# Patient Record
Sex: Female | Born: 1950 | Race: White | Hispanic: No | Marital: Married | Smoking: Current every day smoker
Health system: Southern US, Community
[De-identification: ages and names within clinical notes are randomized; demographics above are authoritative.]

## PROBLEM LIST (undated history)

## (undated) DIAGNOSIS — E785 Hyperlipidemia, unspecified: Secondary | ICD-10-CM

## (undated) HISTORY — PX: TUBAL LIGATION: SHX77

## (undated) HISTORY — DX: Hyperlipidemia, unspecified: E78.5

---

## 2002-08-05 ENCOUNTER — Ambulatory Visit (HOSPITAL_COMMUNITY): Admission: RE | Admit: 2002-08-05 | Discharge: 2002-08-05 | Payer: Self-pay | Admitting: Family Medicine

## 2002-08-05 ENCOUNTER — Encounter: Payer: Self-pay | Admitting: Family Medicine

## 2003-12-06 ENCOUNTER — Ambulatory Visit (HOSPITAL_COMMUNITY): Admission: RE | Admit: 2003-12-06 | Discharge: 2003-12-06 | Payer: Self-pay | Admitting: Family Medicine

## 2005-02-11 ENCOUNTER — Ambulatory Visit (HOSPITAL_COMMUNITY): Admission: RE | Admit: 2005-02-11 | Discharge: 2005-02-11 | Payer: Self-pay | Admitting: Family Medicine

## 2005-02-14 ENCOUNTER — Ambulatory Visit (HOSPITAL_COMMUNITY): Admission: RE | Admit: 2005-02-14 | Discharge: 2005-02-14 | Payer: Self-pay | Admitting: Family Medicine

## 2005-02-24 ENCOUNTER — Ambulatory Visit (HOSPITAL_COMMUNITY): Admission: RE | Admit: 2005-02-24 | Discharge: 2005-02-24 | Payer: Self-pay | Admitting: Family Medicine

## 2005-03-03 ENCOUNTER — Ambulatory Visit (HOSPITAL_COMMUNITY): Admission: RE | Admit: 2005-03-03 | Discharge: 2005-03-03 | Payer: Self-pay | Admitting: Family Medicine

## 2005-05-14 ENCOUNTER — Ambulatory Visit (HOSPITAL_COMMUNITY): Admission: RE | Admit: 2005-05-14 | Discharge: 2005-05-14 | Payer: Self-pay | Admitting: Family Medicine

## 2005-05-27 ENCOUNTER — Ambulatory Visit (HOSPITAL_COMMUNITY): Admission: RE | Admit: 2005-05-27 | Discharge: 2005-05-27 | Payer: Self-pay | Admitting: Family Medicine

## 2005-09-29 ENCOUNTER — Ambulatory Visit (HOSPITAL_COMMUNITY): Admission: RE | Admit: 2005-09-29 | Discharge: 2005-09-29 | Payer: Self-pay | Admitting: Family Medicine

## 2006-06-10 IMAGING — CT CT CHEST W/ CM
1 of 2 series · 14 of 32 positions shown, 18 images · IV contrast (omnipaque)
Comparison: Chest radiograph dated 02/11/05.

CLINICAL DATA: CT CHEST WITH CONTRAST:
 Following the intravenous administration of contrast media (Omnipaque 300) a CT scan of the chest was obtained.

[Series 998: — · axial · 0.56mm/px · z∈[+1568,+1844]mm · 14 of 66 slices shown, 18 images]
[im 6/66  mediastinal]
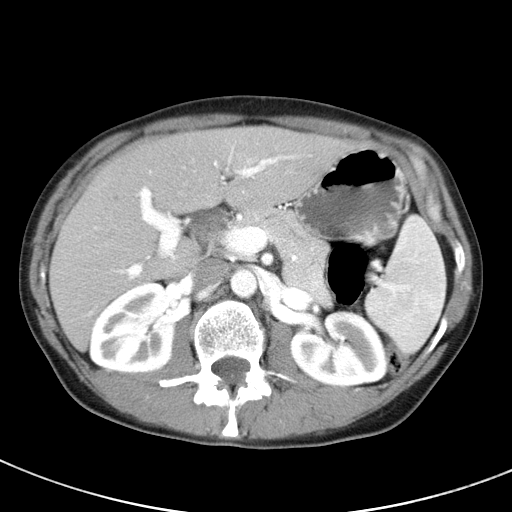
[im 6/66  lung]
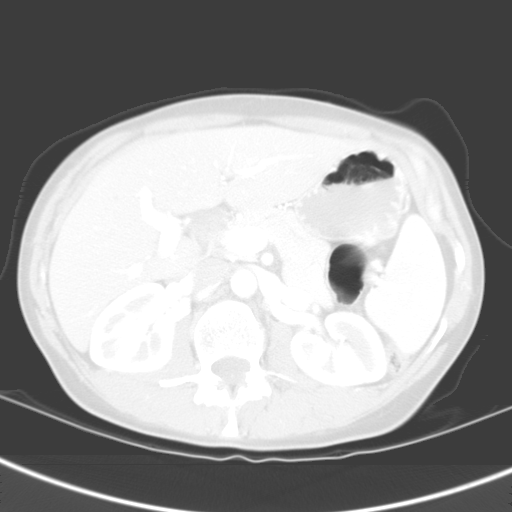
[im 11/66  lung]
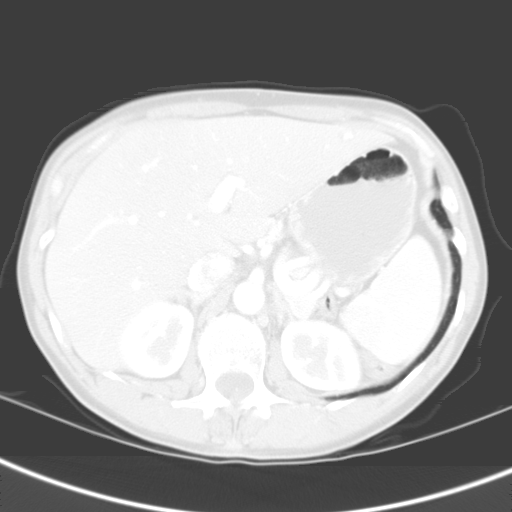
[im 16/66  lung]
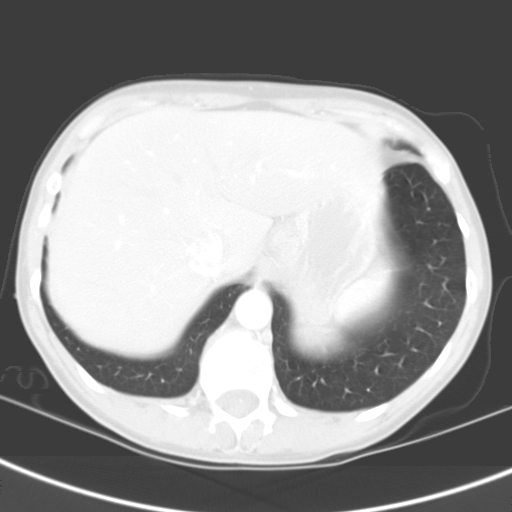
[im 21/66  lung]
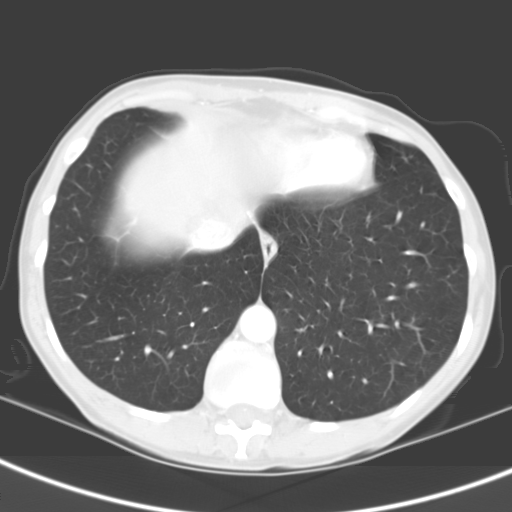
[im 26/66  mediastinal]
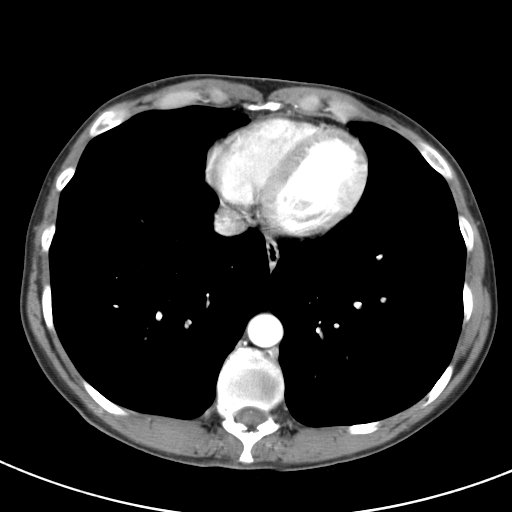
[im 26/66  lung]
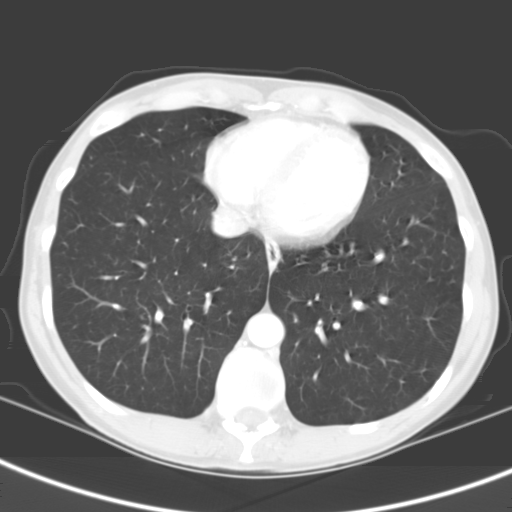
[im 31/66  lung]
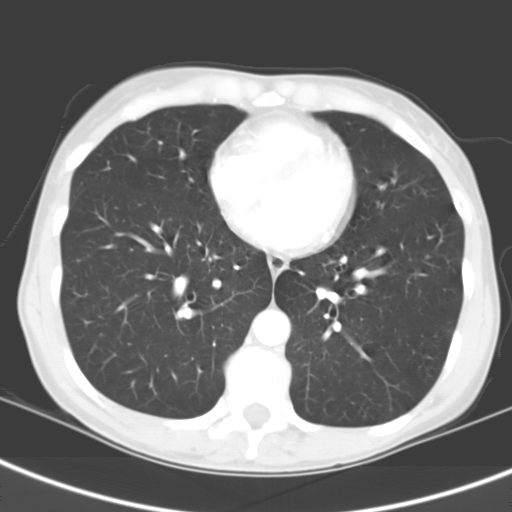
[im 32/66  lung]
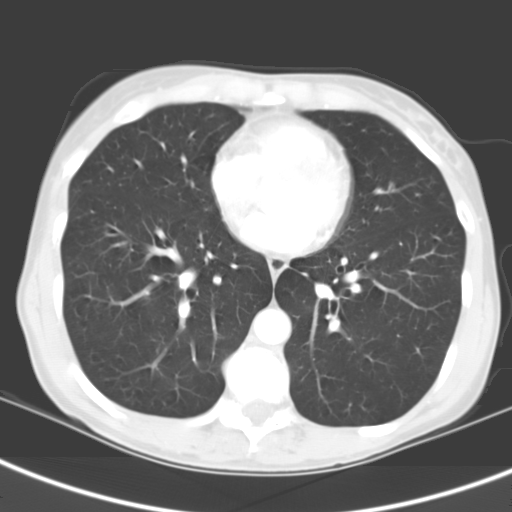
[im 33/66  lung]
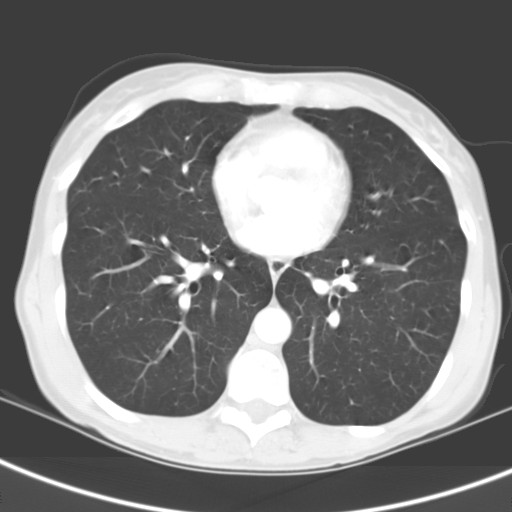
[im 36/66  mediastinal]
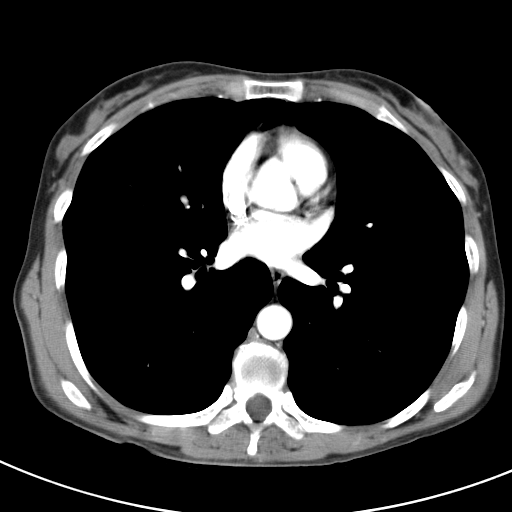
[im 36/66  lung]
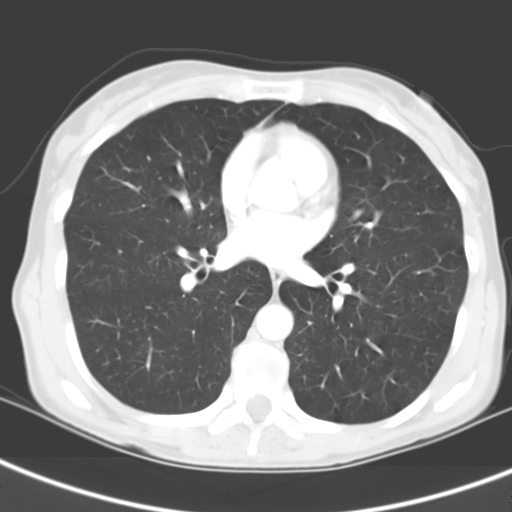
[im 41/66  lung]
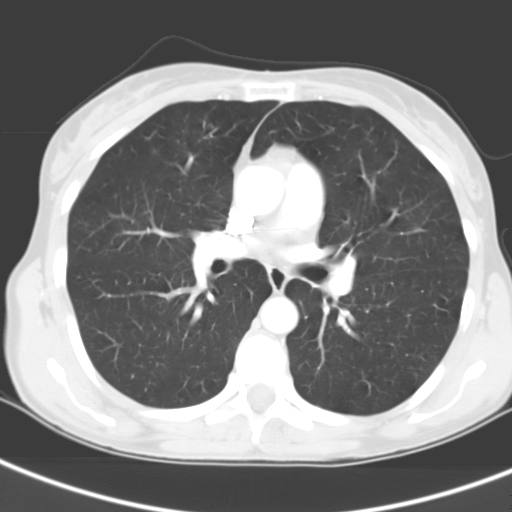
[im 46/66  lung]
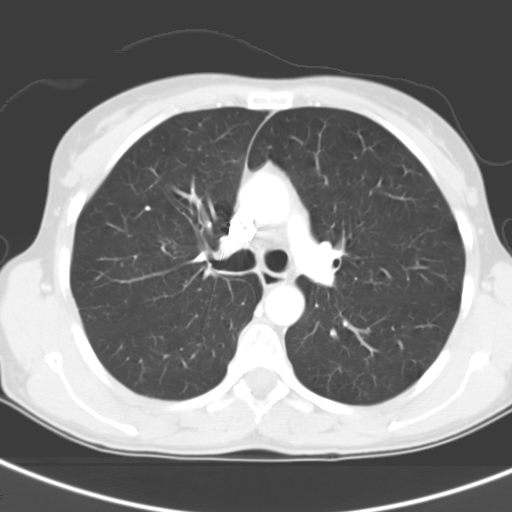
[im 51/66  lung]
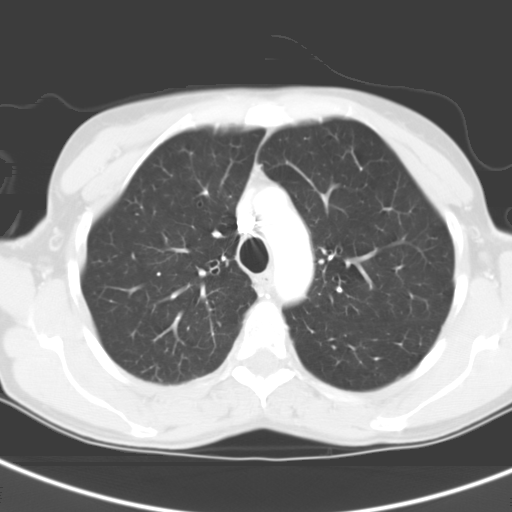
[im 56/66  mediastinal]
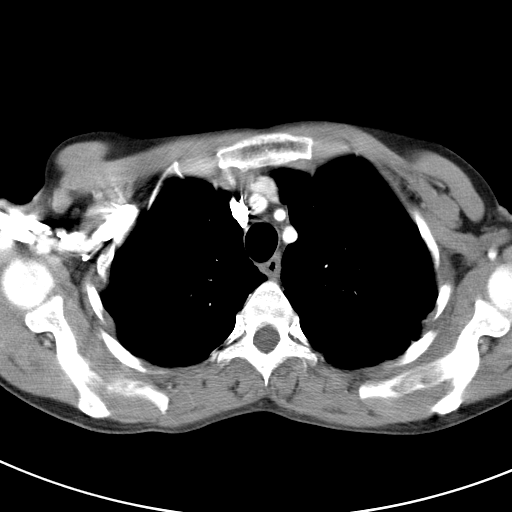
[im 56/66  lung]
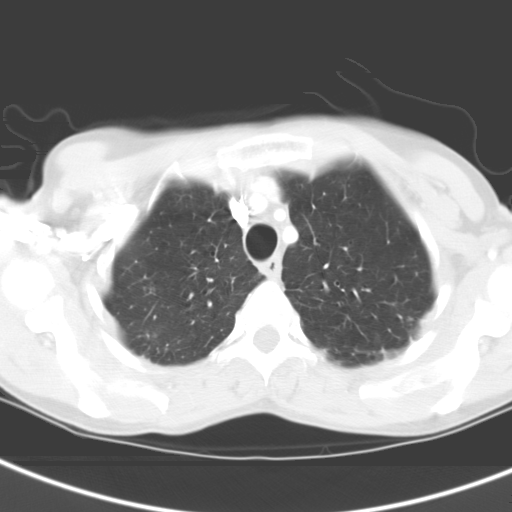
[im 61/66  lung]
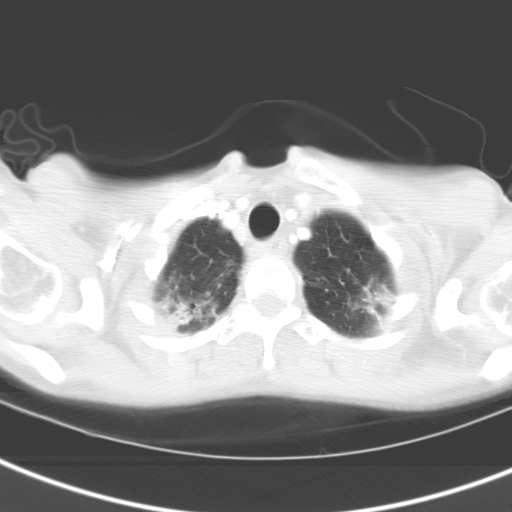

[14 of 32 positions shown; findings below may reference images not displayed]

FINDINGS: No pathologically enlarged axillary, mediastinal, or hilar lymph nodes are identified.  There is no pleural or pericardial effusion.  
 Biapical scarlike opacities are identified.  Severe emphysematous changes are identified.  At the left lower lobe, there is a 5.6 mm nodule (image #43 of the lung window series).  No additional pulmonary nodules or masses are identified.  
 Faint scarlike opacities are identified within the right upper lobe and right middle lobe posteriorly.  
 Within the lateral portion of the left lobe of the liver, there is an 11.2 mm hypodensity with a small area of peripheral contrast.  This is best seen on image #56.  Differential diagnosis includes a benign hemangioma.  Less likely this could represent a hypervascular metastasis or a primary liver tumor, either benign or malignant.  A dedicated CT or MRI of the liver may be helpful for further characterization.  The spleen is unremarkable.  The pancreas is negative.  The adrenal glands are negative.  The visualized portions of the kidneys are also unremarkable.
IMPRESSION: 1.  Pulmonary nodule at left lung base which measures 5.6 mm.  In a patient with a history of smoking, follow-up examination in three months is recommended.
 2.  Peripheral enhancing nodule within the left lobe of the liver, which likely represents a benign hemangioma.  However, further evaluation with a dedicated CT of the liver or MRI of the liver may be helpful for definitive evaluation.

## 2006-08-26 ENCOUNTER — Ambulatory Visit (HOSPITAL_COMMUNITY): Admission: RE | Admit: 2006-08-26 | Discharge: 2006-08-26 | Payer: Self-pay | Admitting: Family Medicine

## 2006-08-28 ENCOUNTER — Ambulatory Visit (HOSPITAL_COMMUNITY): Admission: RE | Admit: 2006-08-28 | Discharge: 2006-08-28 | Payer: Self-pay | Admitting: Family Medicine

## 2008-04-05 ENCOUNTER — Ambulatory Visit (HOSPITAL_COMMUNITY): Admission: RE | Admit: 2008-04-05 | Discharge: 2008-04-05 | Payer: Self-pay | Admitting: Family Medicine

## 2008-04-11 ENCOUNTER — Ambulatory Visit (HOSPITAL_COMMUNITY): Admission: RE | Admit: 2008-04-11 | Discharge: 2008-04-11 | Payer: Self-pay | Admitting: Family Medicine

## 2008-08-03 ENCOUNTER — Ambulatory Visit (HOSPITAL_COMMUNITY): Admission: RE | Admit: 2008-08-03 | Discharge: 2008-08-03 | Payer: Self-pay | Admitting: Family Medicine

## 2009-10-02 ENCOUNTER — Ambulatory Visit (HOSPITAL_COMMUNITY): Admission: RE | Admit: 2009-10-02 | Discharge: 2009-10-02 | Payer: Self-pay | Admitting: Family Medicine

## 2010-02-28 ENCOUNTER — Ambulatory Visit: Payer: Self-pay | Admitting: Gastroenterology

## 2010-02-28 DIAGNOSIS — K625 Hemorrhage of anus and rectum: Secondary | ICD-10-CM | POA: Insufficient documentation

## 2010-03-15 ENCOUNTER — Ambulatory Visit (HOSPITAL_COMMUNITY): Admission: RE | Admit: 2010-03-15 | Discharge: 2010-03-15 | Payer: Self-pay | Admitting: Gastroenterology

## 2010-03-15 ENCOUNTER — Telehealth: Payer: Self-pay | Admitting: Gastroenterology

## 2010-03-15 ENCOUNTER — Ambulatory Visit: Payer: Self-pay | Admitting: Gastroenterology

## 2010-03-15 HISTORY — PX: COLONOSCOPY: SHX174

## 2010-03-20 ENCOUNTER — Encounter (INDEPENDENT_AMBULATORY_CARE_PROVIDER_SITE_OTHER): Payer: Self-pay

## 2011-01-21 NOTE — Letter (Signed)
Summary: Patient Notice, Colon Biopsy Results  [facility]  [address]   [place], [place] [address]   Phone: [phone]  Fax: [phone]       March 20, 2010   MEILY GLOWACKI [address] [place], [place]  [address] 23-Jan-1951    Dear Ms. Hofstra,  I am pleased to inform you that the biopsies taken during your recent colonoscopy did not show any evidence of cancer upon pathologic examination.  Additional information/recommendations:  __Please follow a high fiber diet  __You should have a repeat colonoscopy examination  in 10 years.  Please call us if you are having persistent problems or have questions about your condition that have not been fully answered at this time.  Sincerely,    Hendricks Limes LPN  [facility] Ph: [phone]    Fax: [phone]

## 2011-01-21 NOTE — Assessment & Plan Note (Signed)
Summary: RECTAL BLEEDING   Visit Type:  Initial Consult Referring Provider:  ZOXWRUE Primary Care Provider:  Regino Schultze, M.D.  Chief Complaint:  rectal bleeding.  History of Present Illness: Passing blood 3 times. Not a lot. bright red. Has had hemorrhoids but always external. Takes care of grandkids and always lifting kids. Once rectal aching. No rectal itching. No constipation. BM every day. Rare loose stool. No abd pain, problems swallowing, nausea, or vomiting, change in bowel habits, or weight loss. Usu weighs 95-96 lbs. Appetite pretty good. Rare heartburn or indigestion if she eats the wrong thing. TCS: never  Preventive Screening-Counseling & Management  Alcohol-Tobacco     Smoking Status: current      Drug Use:  no.    Current Medications (verified): 1)  Asa 81 Mg .... Take 1 Tablet By Mouth Once A Day 2)  Caltrate 600 Mg Plus D .... Take 1 Tablet By Mouth Once A Day 3)  Aleve .... As Needed  Allergies (verified): No Known Drug Allergies  Past History:  Past Medical History: "Spot on lungs and gallbladder" Gallstone  Past Surgical History: C Sxn Excision of external hemorrhoids  Family History: No FH of Colon Cancer or polyps No Family History of Breast Cancer: No Family History of Ovarian Cancer: No Family History of Pancreatic Cancer: No Family History of Stomach Cancer: No Family History of Uterine Cancer:  Social History: Keeps grandkids. Patient currently smokes: 1 pk/d. Smokes less around kids. Alcohol Use - no Married x 36-husband drinks EtOH. 3 kids, youngest 11 Illicit Drug Use - no Smoking Status:  current Drug Use:  no  Review of Systems       Per HPI otherwise all systems negative.  OCT 2010: 91 LBS  OCT 2010: HB 14.2 PLT 339 CR 0.67 NL HFP  Vital Signs:  Patient profile:   60 year old female Height:      60 inches Weight:      97 pounds BMI:     19.01 Temp:     98.3 degrees F oral Pulse rate:   96 / minute BP sitting:   120 / 80   (left arm) Cuff size:   regular  Vitals Entered By: Cloria Spring LPN (February 28, 2010 8:57 AM)  Physical Exam  General:  Well developed, well nourished, no acute distress. Head:  Normocephalic and atraumatic. Eyes:  PERRLA, no icterus. Mouth:  No deformity or lesions, dentition normal. Neck:  Supple; no masses. Lungs:  Clear throughout to auscultation. Heart:  Regular rate and rhythm; no murmurs, rubs,  or bruits. Abdomen:  Soft, nontender and nondistended.Normal bowel sounds. Extremities:  No edema or deformities noted. Neurologic:  Alert and  oriented x4;  grossly normal neurologically.  Impression & Recommendations:  Problem # 1:  RECTAL BLEEDING (ICD-569.3) Assessment New  Most likely internal hemorrhoids; doubt colorectal polyp or cancer. TCS MAR 25-half-lyltely, PEDS SCOPE. OPV as needed.  CC: PCP  Orders: Consultation Level III [id number])

## 2011-01-21 NOTE — Progress Notes (Signed)
Summary: Laura Lamb [facility]  Discussed management options. Pt declines surgery at this time. Add Colace 100 mg aily to soften stool. West Bali MD  March 15, 2010 1:12 PM      New/Updated Medications: ANUSOL-HC 25 MG SUPP (HYDROCORTISONE ACETATE) 1 pr q12h for 14 days. Repeat for 14 additional days if still having rectal bleeding Prescriptions: ANUSOL-HC 25 MG SUPP (HYDROCORTISONE ACETATE) 1 pr q12h for 14 days. Repeat for 14 additional days if still having rectal bleeding  #28 x 1   Entered and Authorized by:   West Bali MD   Signed by:   West Bali MD on 03/15/2010   Method used:   Electronically to        [facility] (retail)       [address]       [place], [place]  [address]       Ph: [phone]       Fax: [phone]   RxID:   [id number]

## 2011-01-21 NOTE — Letter (Signed)
Summary: TCS ORDER  TCS ORDER   Imported By: Ave Filter 02/28/2010 09:33:42  _____________________________________________________________________  External Attachment:    Type:   Image     Comment:   External Document

## 2012-02-16 ENCOUNTER — Other Ambulatory Visit (HOSPITAL_COMMUNITY): Payer: Self-pay | Admitting: Family Medicine

## 2012-02-16 DIAGNOSIS — Z139 Encounter for screening, unspecified: Secondary | ICD-10-CM

## 2012-02-19 ENCOUNTER — Ambulatory Visit (HOSPITAL_COMMUNITY)
Admission: RE | Admit: 2012-02-19 | Discharge: 2012-02-19 | Disposition: A | Discharge status: Home / Self Care | Payer: Managed Care, Other (non HMO) | Source: Ambulatory Visit | Attending: Family Medicine | Admitting: Family Medicine

## 2012-02-19 DIAGNOSIS — Z139 Encounter for screening, unspecified: Secondary | ICD-10-CM

## 2012-02-19 DIAGNOSIS — Z1231 Encounter for screening mammogram for malignant neoplasm of breast: Secondary | ICD-10-CM | POA: Insufficient documentation

## 2014-10-17 ENCOUNTER — Encounter (HOSPITAL_COMMUNITY): Admission: RE | Payer: Self-pay | Source: Ambulatory Visit

## 2014-10-17 ENCOUNTER — Ambulatory Visit (HOSPITAL_COMMUNITY)
Admission: RE | Admit: 2014-10-17 | Payer: Managed Care, Other (non HMO) | Source: Ambulatory Visit | Admitting: Ophthalmology

## 2014-10-17 SURGERY — TREATMENT, USING YAG LASER
Anesthesia: LOCAL | Laterality: Left

## 2018-06-09 ENCOUNTER — Encounter (HOSPITAL_COMMUNITY): Payer: Self-pay | Admitting: Emergency Medicine

## 2018-06-09 ENCOUNTER — Other Ambulatory Visit: Payer: Self-pay

## 2018-06-09 ENCOUNTER — Emergency Department (HOSPITAL_COMMUNITY)
Admission: EM | Admit: 2018-06-09 | Discharge: 2018-06-09 | Disposition: A | Discharge status: Home / Self Care | Payer: PPO | Attending: Emergency Medicine | Admitting: Emergency Medicine

## 2018-06-09 DIAGNOSIS — Z720 Tobacco use: Secondary | ICD-10-CM

## 2018-06-09 DIAGNOSIS — K2900 Acute gastritis without bleeding: Secondary | ICD-10-CM | POA: Diagnosis not present

## 2018-06-09 DIAGNOSIS — F17200 Nicotine dependence, unspecified, uncomplicated: Secondary | ICD-10-CM | POA: Insufficient documentation

## 2018-06-09 DIAGNOSIS — R109 Unspecified abdominal pain: Secondary | ICD-10-CM | POA: Diagnosis present

## 2018-06-09 DIAGNOSIS — F17210 Nicotine dependence, cigarettes, uncomplicated: Secondary | ICD-10-CM | POA: Diagnosis not present

## 2018-06-09 LAB — COMPREHENSIVE METABOLIC PANEL WITH GFR
ALT: 47 U/L (ref 14–54)
AST: 43 U/L — ABNORMAL HIGH (ref 15–41)
Albumin: 4.1 g/dL (ref 3.5–5.0)
Alkaline Phosphatase: 95 U/L (ref 38–126)
Anion gap: 8 (ref 5–15)
BUN: 11 mg/dL (ref 6–20)
CO2: 25 mmol/L (ref 22–32)
Calcium: 9.1 mg/dL (ref 8.9–10.3)
Chloride: 102 mmol/L (ref 101–111)
Creatinine, Ser: 0.61 mg/dL (ref 0.44–1.00)
GFR calc Af Amer: >60 mL/min
GFR calc non Af Amer: >60 mL/min
Glucose, Bld: 115 mg/dL — ABNORMAL HIGH (ref 65–99)
Potassium: 3.6 mmol/L (ref 3.5–5.1)
Sodium: 135 mmol/L (ref 135–145)
Total Bilirubin: 0.5 mg/dL (ref 0.3–1.2)
Total Protein: 7.3 g/dL (ref 6.5–8.1)

## 2018-06-09 LAB — URINALYSIS, ROUTINE W REFLEX MICROSCOPIC
Bacteria, UA: NONE SEEN
Bilirubin Urine: NEGATIVE
Glucose, UA: NEGATIVE mg/dL
Hgb urine dipstick: NEGATIVE
Ketones, ur: NEGATIVE mg/dL
Nitrite: NEGATIVE
Protein, ur: 30 mg/dL — AB
Specific Gravity, Urine: 1.026 (ref 1.005–1.030)
pH: 5 (ref 5.0–8.0)

## 2018-06-09 LAB — CBC
HCT: 42.1 % (ref 36.0–46.0)
Hemoglobin: 14.7 g/dL (ref 12.0–15.0)
MCH: 32.7 pg (ref 26.0–34.0)
MCHC: 34.9 g/dL (ref 30.0–36.0)
MCV: 93.8 fL (ref 78.0–100.0)
Platelets: 296 10*3/uL (ref 150–400)
RBC: 4.49 MIL/uL (ref 3.87–5.11)
RDW: 12.4 % (ref 11.5–15.5)
WBC: 7.4 10*3/uL (ref 4.0–10.5)

## 2018-06-09 LAB — LIPASE, BLOOD: Lipase: 39 U/L (ref 11–51)

## 2018-06-09 MED ORDER — PANTOPRAZOLE SODIUM 20 MG PO TBEC: 20.0000 mg | DELAYED_RELEASE_TABLET | Freq: Every day | ORAL | 2 refills | Status: DC

## 2018-06-09 NOTE — Discharge Instructions (Signed)
Use the Protonix prescription once a day for 1 to 3 months to see if it helps your discomfort.  You can also supplement this with an antacid, such as Tums, or Maalox.  Follow-up with a primary care doctor if you are not better in 1 or 2 weeks.  Try to stop smoking as this could improve your condition.

## 2018-06-09 NOTE — ED Triage Notes (Signed)
Pt c/o of upper abdominal pain x 4 days with n/v/d. Worsening with eating.

## 2018-06-09 NOTE — ED Provider Notes (Signed)
[facility] EMERGENCY DEPARTMENT Provider Note   CSN: [id number] Arrival date & time: 06/09/18  1442     History   Chief Complaint Chief Complaint  Patient presents with  . Abdominal Pain    HPI Laura Lamb is a 67 y.o. female.  HPI   She presents for evaluation of nausea, vomiting, diarrhea and upper abdominal pain.  Symptoms present for 5 days, vomiting has resolved.  When she was vomiting it looked like food.  She smokes cigarettes.  Her diarrhea has been loose and brown in color.  She denies chest pain, shortness of breath, weakness or dizziness.  No prior similar problems.  There are no other known modifying factors.  History reviewed. No pertinent past medical history.  Patient Active Problem List   Diagnosis Date Noted  . RECTAL BLEEDING 02/28/2010    Past Surgical History:  Procedure Laterality Date  . CESAREAN SECTION    . TUBAL LIGATION       OB History   None      Home Medications    Prior to Admission medications   Medication Sig Start Date End Date Taking? Authorizing Provider  pantoprazole (PROTONIX) 20 MG tablet Take 1 tablet (20 mg total) by mouth daily. 06/09/18   Mancel Bale, MD    Family History History reviewed. No pertinent family history.  Social History Social History   Tobacco Use  . Smoking status: Current Every Day Smoker  . Smokeless tobacco: Never Used  Substance Use Topics  . Alcohol use: Never    Frequency: Never  . Drug use: Never     Allergies   Patient has no allergy information on record.   Review of Systems Review of Systems  All other systems reviewed and are negative.    Physical Exam Updated Vital Signs BP (!) 142/96 (BP Location: Right Arm)   Pulse 99   Temp 99.4 F (37.4 C) (Oral)   Resp 18   Ht 5' (1.524 m)   Wt 42.6 kg (94 lb)   SpO2 100%   BMI 18.36 kg/m   Physical Exam  Constitutional: She is oriented to person, place, and time. She appears well-developed.  He appears older than  stated age.  HENT:  Head: Normocephalic and atraumatic.  Eyes: Pupils are equal, round, and reactive to light. Conjunctivae and EOM are normal.  Neck: Normal range of motion and phonation normal. Neck supple.  Cardiovascular: Normal rate and regular rhythm.  Pulmonary/Chest: Effort normal and breath sounds normal. No stridor. No respiratory distress. She has no wheezes. She exhibits no tenderness.  Abdominal: Soft. She exhibits no distension. There is no tenderness. There is no guarding.  Musculoskeletal: Normal range of motion.  Neurological: She is alert and oriented to person, place, and time. She exhibits normal muscle tone.  Skin: Skin is warm and dry.  Psychiatric: She has a normal mood and affect. Her behavior is normal. Judgment and thought content normal.  Nursing note and vitals reviewed.    ED Treatments / Results  Labs (all labs ordered are listed, but only abnormal results are displayed) Labs Reviewed  COMPREHENSIVE METABOLIC PANEL - Abnormal; Notable for the following components:      Result Value   Glucose, Bld 115 (*)    AST 43 (*)    All other components within normal limits  URINALYSIS, ROUTINE W REFLEX MICROSCOPIC - Abnormal; Notable for the following components:   APPearance HAZY (*)    Protein, ur 30 (*)  Leukocytes, UA TRACE (*)    All other components within normal limits  LIPASE, BLOOD  CBC    EKG None  Radiology No results found.  Procedures Procedures (including critical care time)  Medications Ordered in ED Medications - No data to display   Initial Impression / Assessment and Plan / ED Course  I have reviewed the triage vital signs and the nursing notes.  Pertinent labs & imaging results that were available during my care of the patient were reviewed by me and considered in my medical decision making (see chart for details).      Patient Vitals for the past 24 hrs:  BP Temp Temp src Pulse Resp SpO2 Height Weight  06/09/18 1449 (!)  142/96 99.4 F (37.4 C) Oral 99 18 100 % 5' (1.524 m) 42.6 kg (94 lb)    3:52 PM Reevaluation with update and discussion. After initial assessment and treatment, an updated evaluation reveals no change in status.  Findings discussed with patient and husband, all questions answered. Mancel BaleElliott Elizabeht Suto   Medical Decision Making: Abdominal pain with nonspecific GI symptoms.  The condition is likely aggravated by tobacco abuse.  Differential diagnosis includes gastritis, peptic ulcer disease, nonspecific gastrointestinal disorder.  Doubt respiratory infection, metabolic instability or impending vascular collapse.  CRITICAL CARE-no Performed by: Mancel BaleElliott Jerl Munyan  Nursing Notes Reviewed/ Care Coordinated Applicable Imaging Reviewed Interpretation of Laboratory Data incorporated into ED treatment  The patient appears reasonably screened and/or stabilized for discharge and I doubt any other medical condition or other [facility] requiring further screening, evaluation, or treatment in the ED at this time prior to discharge.  Plan: Home Medications-antacid of choice; Home Treatments-stop smoking; return here if the recommended treatment, does not improve the symptoms; Recommended follow up-PCP follow-up 1 to 2 weeks   Final Clinical Impressions(s) / ED Diagnoses   Final diagnoses:  Acute gastritis without hemorrhage, unspecified gastritis type  Tobacco abuse    ED Discharge Orders        Ordered    pantoprazole (PROTONIX) 20 MG tablet  Daily     06/09/18 1549       Mancel BaleWentz, Wael Maestas, MD 06/09/18 1553

## 2018-06-28 DIAGNOSIS — K219 Gastro-esophageal reflux disease without esophagitis: Secondary | ICD-10-CM | POA: Diagnosis not present

## 2018-06-28 DIAGNOSIS — Z681 Body mass index (BMI) 19 or less, adult: Secondary | ICD-10-CM | POA: Diagnosis not present

## 2018-06-28 DIAGNOSIS — Z0189 Encounter for other specified special examinations: Secondary | ICD-10-CM | POA: Diagnosis not present

## 2018-07-01 ENCOUNTER — Other Ambulatory Visit (HOSPITAL_COMMUNITY): Payer: Self-pay | Admitting: Internal Medicine

## 2018-07-01 DIAGNOSIS — Z1231 Encounter for screening mammogram for malignant neoplasm of breast: Secondary | ICD-10-CM

## 2018-07-05 ENCOUNTER — Ambulatory Visit (HOSPITAL_COMMUNITY)
Admission: RE | Admit: 2018-07-05 | Discharge: 2018-07-05 | Disposition: A | Discharge status: Home / Self Care | Payer: PPO | Source: Ambulatory Visit | Attending: Internal Medicine | Admitting: Internal Medicine

## 2018-07-05 ENCOUNTER — Encounter (HOSPITAL_COMMUNITY): Payer: Self-pay

## 2018-07-05 DIAGNOSIS — Z1231 Encounter for screening mammogram for malignant neoplasm of breast: Secondary | ICD-10-CM

## 2018-07-12 ENCOUNTER — Other Ambulatory Visit (HOSPITAL_COMMUNITY): Payer: Self-pay | Admitting: Internal Medicine

## 2018-07-12 DIAGNOSIS — R928 Other abnormal and inconclusive findings on diagnostic imaging of breast: Secondary | ICD-10-CM

## 2018-07-16 ENCOUNTER — Other Ambulatory Visit (HOSPITAL_COMMUNITY): Payer: Self-pay | Admitting: Internal Medicine

## 2018-07-16 DIAGNOSIS — R928 Other abnormal and inconclusive findings on diagnostic imaging of breast: Secondary | ICD-10-CM

## 2018-07-20 ENCOUNTER — Ambulatory Visit (HOSPITAL_COMMUNITY)
Admission: RE | Admit: 2018-07-20 | Discharge: 2018-07-20 | Disposition: A | Discharge status: Home / Self Care | Payer: PPO | Source: Ambulatory Visit | Attending: Internal Medicine | Admitting: Internal Medicine

## 2018-07-20 DIAGNOSIS — R922 Inconclusive mammogram: Secondary | ICD-10-CM | POA: Diagnosis not present

## 2018-07-20 DIAGNOSIS — R928 Other abnormal and inconclusive findings on diagnostic imaging of breast: Secondary | ICD-10-CM | POA: Insufficient documentation

## 2018-07-21 DIAGNOSIS — K219 Gastro-esophageal reflux disease without esophagitis: Secondary | ICD-10-CM | POA: Diagnosis not present

## 2018-07-21 DIAGNOSIS — Z681 Body mass index (BMI) 19 or less, adult: Secondary | ICD-10-CM | POA: Diagnosis not present

## 2018-07-21 DIAGNOSIS — Z0189 Encounter for other specified special examinations: Secondary | ICD-10-CM | POA: Diagnosis not present

## 2018-07-21 DIAGNOSIS — Z0000 Encounter for general adult medical examination without abnormal findings: Secondary | ICD-10-CM | POA: Diagnosis not present

## 2018-07-28 DIAGNOSIS — Z0000 Encounter for general adult medical examination without abnormal findings: Secondary | ICD-10-CM | POA: Diagnosis not present

## 2018-07-28 DIAGNOSIS — Z681 Body mass index (BMI) 19 or less, adult: Secondary | ICD-10-CM | POA: Diagnosis not present

## 2018-07-28 DIAGNOSIS — F17200 Nicotine dependence, unspecified, uncomplicated: Secondary | ICD-10-CM | POA: Diagnosis not present

## 2018-07-28 DIAGNOSIS — E782 Mixed hyperlipidemia: Secondary | ICD-10-CM | POA: Diagnosis not present

## 2018-07-28 DIAGNOSIS — K219 Gastro-esophageal reflux disease without esophagitis: Secondary | ICD-10-CM | POA: Diagnosis not present

## 2018-07-28 DIAGNOSIS — Z0189 Encounter for other specified special examinations: Secondary | ICD-10-CM | POA: Diagnosis not present

## 2018-07-29 ENCOUNTER — Other Ambulatory Visit: Payer: Self-pay | Admitting: Internal Medicine

## 2018-07-29 DIAGNOSIS — Z780 Asymptomatic menopausal state: Secondary | ICD-10-CM

## 2018-08-30 ENCOUNTER — Ambulatory Visit (HOSPITAL_COMMUNITY)
Admission: RE | Admit: 2018-08-30 | Discharge: 2018-08-30 | Disposition: A | Discharge status: Home / Self Care | Payer: PPO | Source: Ambulatory Visit | Attending: Internal Medicine | Admitting: Internal Medicine

## 2018-08-30 DIAGNOSIS — M810 Age-related osteoporosis without current pathological fracture: Secondary | ICD-10-CM | POA: Insufficient documentation

## 2018-08-30 DIAGNOSIS — Z780 Asymptomatic menopausal state: Secondary | ICD-10-CM | POA: Diagnosis not present

## 2018-09-16 DIAGNOSIS — K219 Gastro-esophageal reflux disease without esophagitis: Secondary | ICD-10-CM | POA: Diagnosis not present

## 2018-09-16 DIAGNOSIS — E782 Mixed hyperlipidemia: Secondary | ICD-10-CM | POA: Diagnosis not present

## 2019-02-02 DIAGNOSIS — E782 Mixed hyperlipidemia: Secondary | ICD-10-CM | POA: Diagnosis not present

## 2019-02-09 DIAGNOSIS — E782 Mixed hyperlipidemia: Secondary | ICD-10-CM | POA: Diagnosis not present

## 2019-02-09 DIAGNOSIS — K219 Gastro-esophageal reflux disease without esophagitis: Secondary | ICD-10-CM | POA: Diagnosis not present

## 2019-02-09 DIAGNOSIS — M810 Age-related osteoporosis without current pathological fracture: Secondary | ICD-10-CM | POA: Diagnosis not present

## 2019-02-09 DIAGNOSIS — Z716 Tobacco abuse counseling: Secondary | ICD-10-CM | POA: Diagnosis not present

## 2019-02-09 DIAGNOSIS — F17200 Nicotine dependence, unspecified, uncomplicated: Secondary | ICD-10-CM | POA: Diagnosis not present

## 2019-04-07 DIAGNOSIS — Z0000 Encounter for general adult medical examination without abnormal findings: Secondary | ICD-10-CM | POA: Diagnosis not present

## 2019-07-21 ENCOUNTER — Other Ambulatory Visit: Payer: Self-pay

## 2019-08-03 DIAGNOSIS — E782 Mixed hyperlipidemia: Secondary | ICD-10-CM | POA: Diagnosis not present

## 2019-08-10 DIAGNOSIS — E782 Mixed hyperlipidemia: Secondary | ICD-10-CM | POA: Diagnosis not present

## 2019-08-10 DIAGNOSIS — M810 Age-related osteoporosis without current pathological fracture: Secondary | ICD-10-CM | POA: Diagnosis not present

## 2019-08-10 DIAGNOSIS — F17200 Nicotine dependence, unspecified, uncomplicated: Secondary | ICD-10-CM | POA: Diagnosis not present

## 2019-08-10 DIAGNOSIS — Z0001 Encounter for general adult medical examination with abnormal findings: Secondary | ICD-10-CM | POA: Diagnosis not present

## 2019-08-10 DIAGNOSIS — K219 Gastro-esophageal reflux disease without esophagitis: Secondary | ICD-10-CM | POA: Diagnosis not present

## 2019-11-11 ENCOUNTER — Other Ambulatory Visit: Payer: Self-pay

## 2019-11-13 IMAGING — MG DIGITAL DIAGNOSTIC UNILATERAL LEFT MAMMOGRAM WITH TOMO AND CAD
3 series · 3 of 7 positions shown · non-contrast
Comparison: 07/05/2018 and earlier priors

CLINICAL DATA: 67-year-old patient recalled from recent screening
mammogram for evaluation of a possible mass identified only on the
CC view of the left breast.

EXAM:
DIGITAL DIAGNOSTIC UNILATERAL LEFT MAMMOGRAM WITH CAD AND TOMO

[L CC tomo · tomo slice 15/30.0]
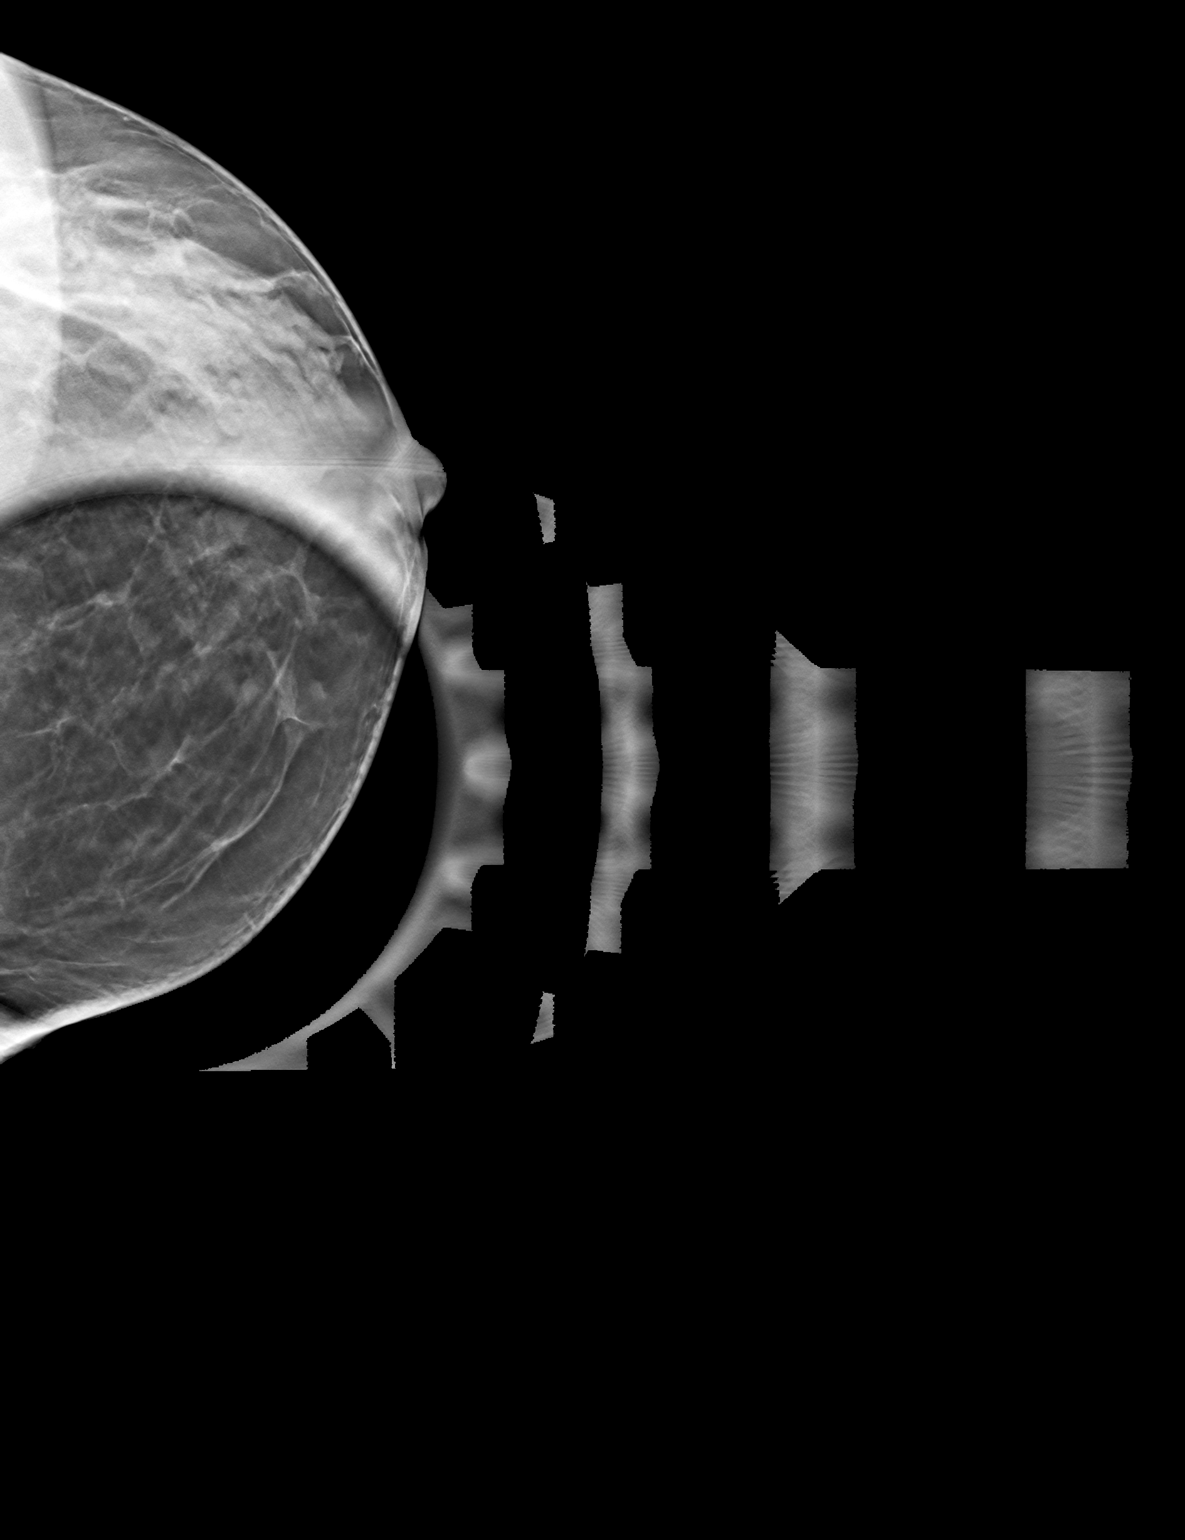

[L CC synth-2D]
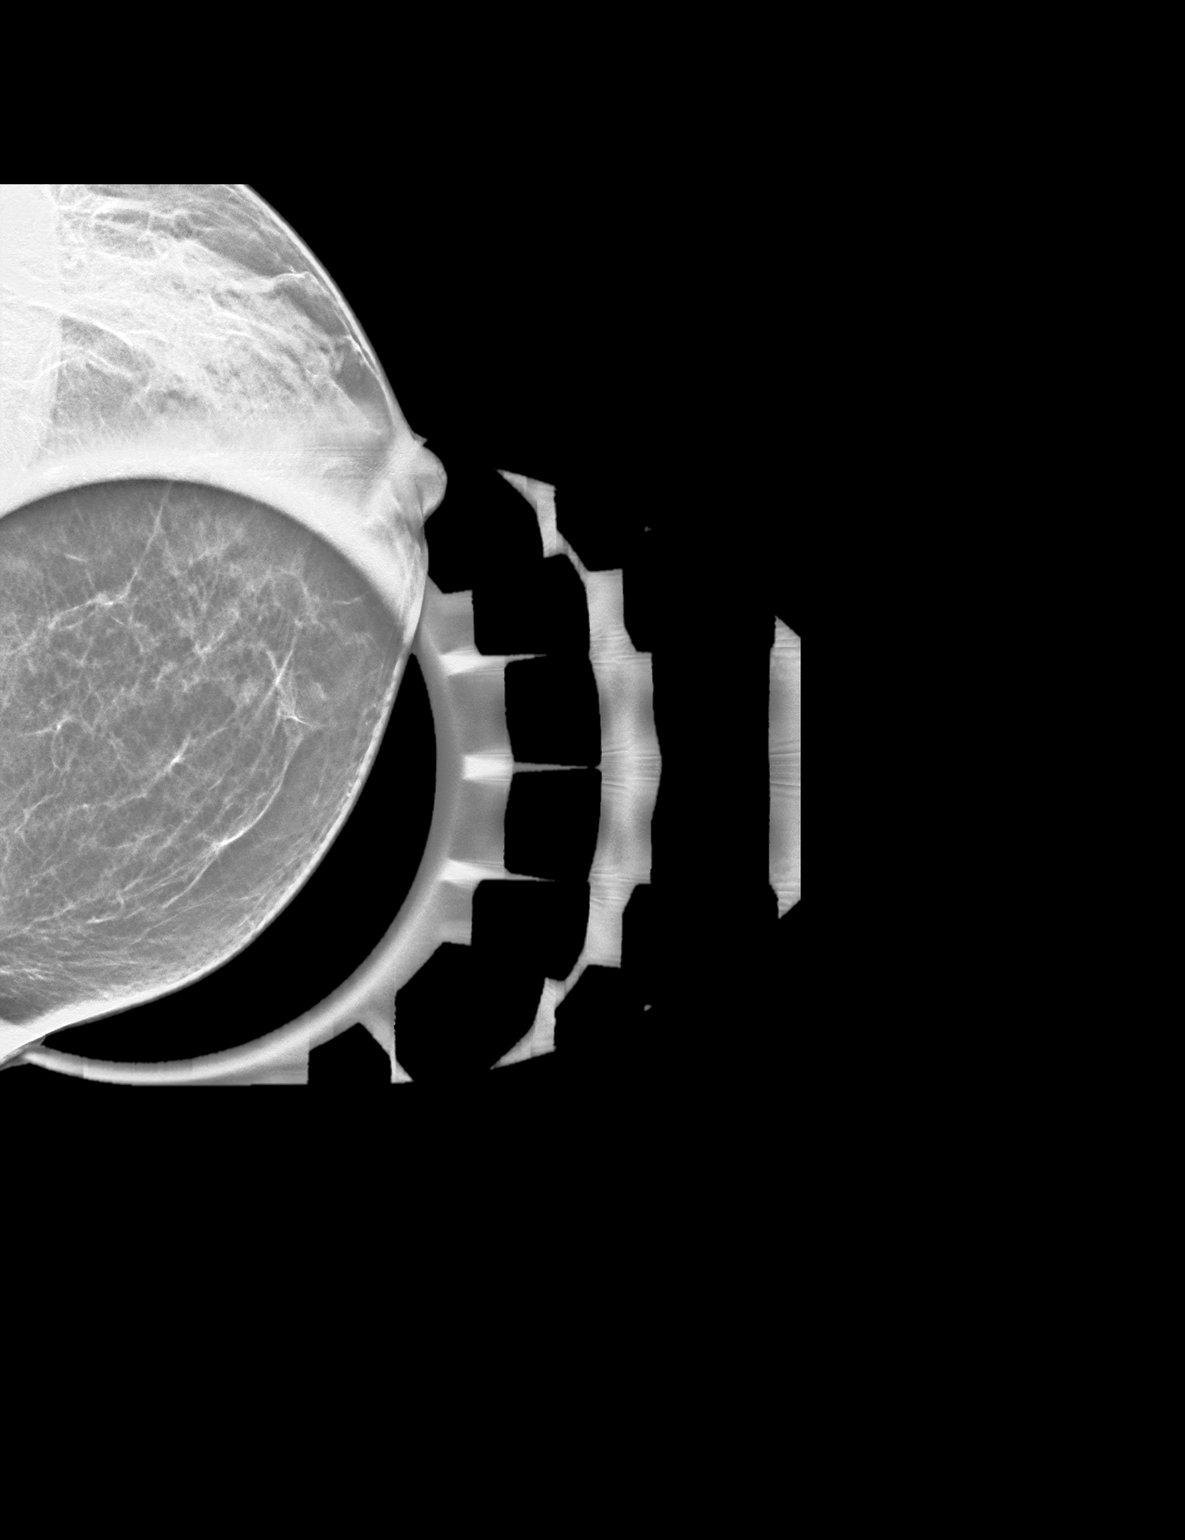

[L ML synth-2D]
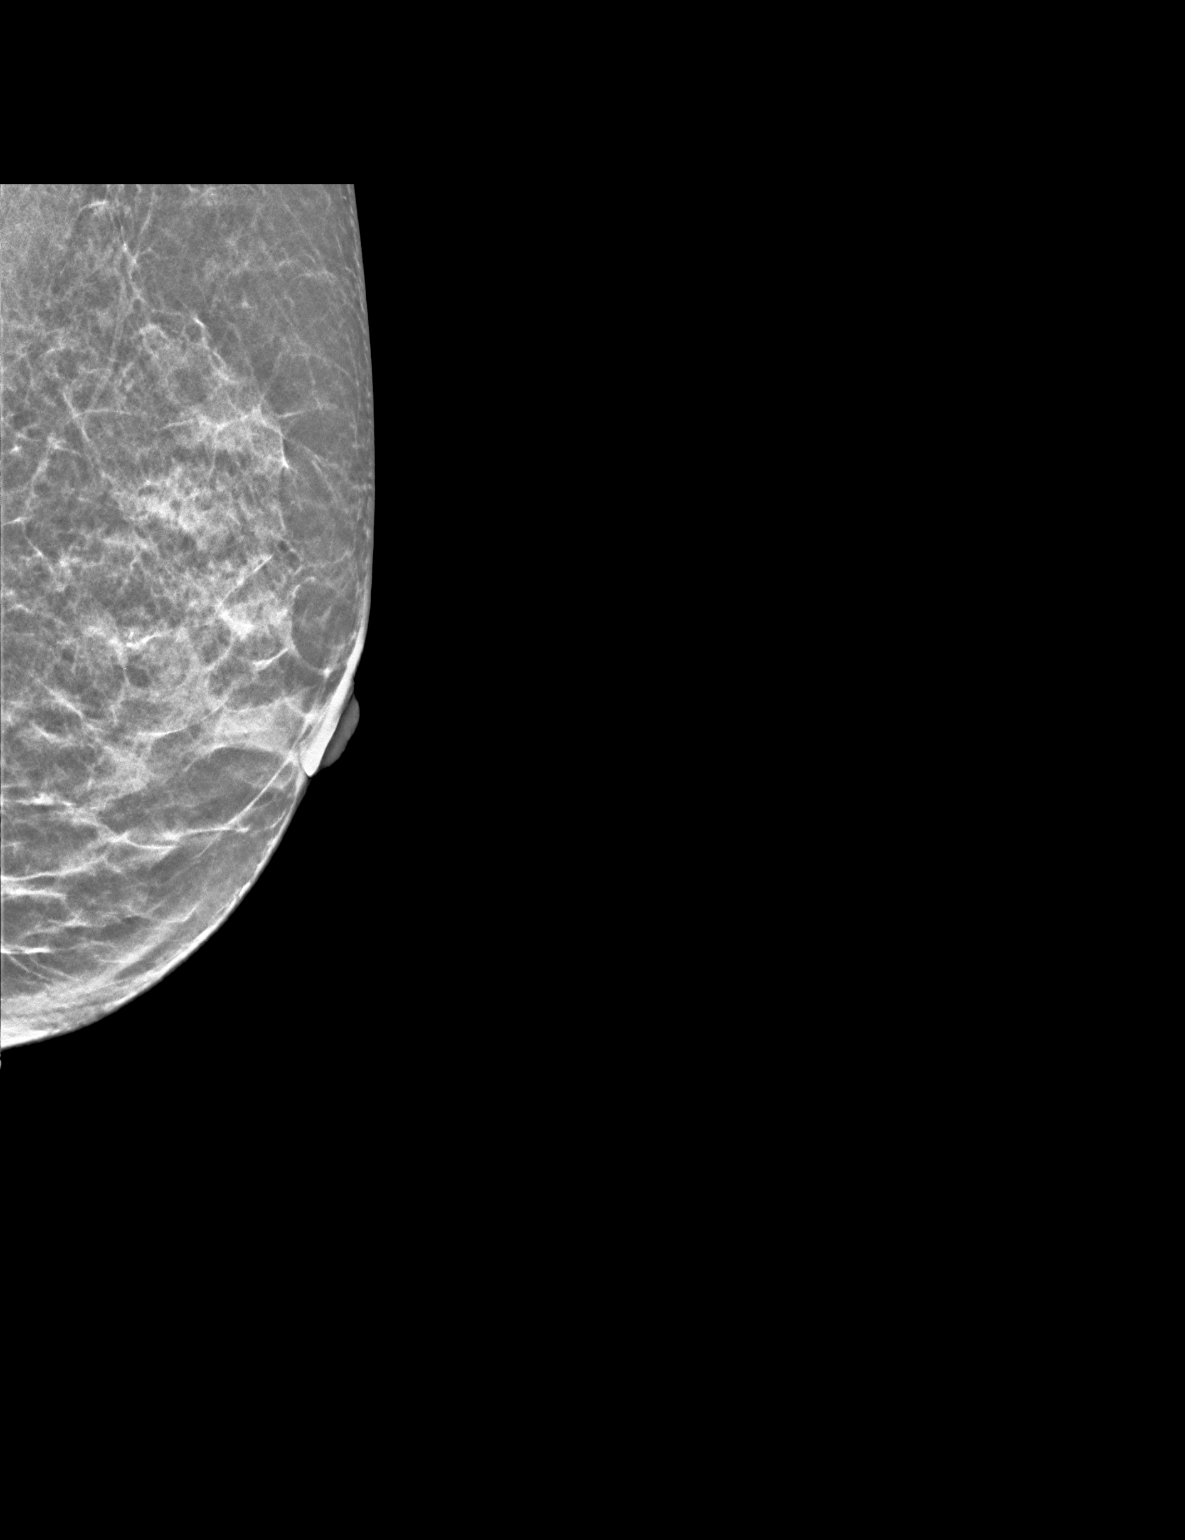

[3 of 7 positions shown; findings below may reference images not displayed]

ACR Breast Density Category c: The breast tissue is heterogeneously
dense, which may obscure small masses.
FINDINGS: Focal spot compression view of the medial left breast, including
tomography, shows dispersion of fibroglandular tissue without
persistent mass, asymmetry, or distortion. 90 degree lateral view of
the left breast with tomography is negative.

Mammographic images were processed with CAD.
IMPRESSION: No evidence of malignancy in the left breast.

RECOMMENDATION:
Screening mammogram in one year.(Code:[id number])

I have discussed the findings and recommendations with the patient.
Results were also provided in writing at the conclusion of the
visit. If applicable, a reminder letter will be sent to the patient
regarding the next appointment.

BI-RADS CATEGORY  1: Negative.

## 2020-01-26 ENCOUNTER — Encounter: Payer: Self-pay | Admitting: Gastroenterology

## 2020-02-03 DIAGNOSIS — E782 Mixed hyperlipidemia: Secondary | ICD-10-CM | POA: Diagnosis not present

## 2020-02-03 DIAGNOSIS — Z0000 Encounter for general adult medical examination without abnormal findings: Secondary | ICD-10-CM | POA: Diagnosis not present

## 2020-02-03 DIAGNOSIS — F17200 Nicotine dependence, unspecified, uncomplicated: Secondary | ICD-10-CM | POA: Diagnosis not present

## 2020-02-03 DIAGNOSIS — Z0189 Encounter for other specified special examinations: Secondary | ICD-10-CM | POA: Diagnosis not present

## 2020-02-03 DIAGNOSIS — M810 Age-related osteoporosis without current pathological fracture: Secondary | ICD-10-CM | POA: Diagnosis not present

## 2020-02-03 DIAGNOSIS — Z681 Body mass index (BMI) 19 or less, adult: Secondary | ICD-10-CM | POA: Diagnosis not present

## 2020-02-03 DIAGNOSIS — Z716 Tobacco abuse counseling: Secondary | ICD-10-CM | POA: Diagnosis not present

## 2020-02-03 DIAGNOSIS — Z0001 Encounter for general adult medical examination with abnormal findings: Secondary | ICD-10-CM | POA: Diagnosis not present

## 2020-02-03 DIAGNOSIS — Z712 Person consulting for explanation of examination or test findings: Secondary | ICD-10-CM | POA: Diagnosis not present

## 2020-02-03 DIAGNOSIS — K219 Gastro-esophageal reflux disease without esophagitis: Secondary | ICD-10-CM | POA: Diagnosis not present

## 2020-02-08 DIAGNOSIS — E782 Mixed hyperlipidemia: Secondary | ICD-10-CM | POA: Diagnosis not present

## 2020-02-08 DIAGNOSIS — F419 Anxiety disorder, unspecified: Secondary | ICD-10-CM | POA: Diagnosis not present

## 2020-02-08 DIAGNOSIS — K219 Gastro-esophageal reflux disease without esophagitis: Secondary | ICD-10-CM | POA: Diagnosis not present

## 2020-02-08 DIAGNOSIS — F17200 Nicotine dependence, unspecified, uncomplicated: Secondary | ICD-10-CM | POA: Diagnosis not present

## 2020-02-08 DIAGNOSIS — M810 Age-related osteoporosis without current pathological fracture: Secondary | ICD-10-CM | POA: Diagnosis not present

## 2020-03-07 DIAGNOSIS — F419 Anxiety disorder, unspecified: Secondary | ICD-10-CM | POA: Diagnosis not present

## 2020-03-20 DIAGNOSIS — I10 Essential (primary) hypertension: Secondary | ICD-10-CM | POA: Diagnosis not present

## 2020-03-20 DIAGNOSIS — E1165 Type 2 diabetes mellitus with hyperglycemia: Secondary | ICD-10-CM | POA: Diagnosis not present

## 2020-05-16 DIAGNOSIS — Z0000 Encounter for general adult medical examination without abnormal findings: Secondary | ICD-10-CM | POA: Diagnosis not present

## 2020-05-16 DIAGNOSIS — Z0001 Encounter for general adult medical examination with abnormal findings: Secondary | ICD-10-CM | POA: Diagnosis not present

## 2020-05-16 DIAGNOSIS — Z716 Tobacco abuse counseling: Secondary | ICD-10-CM | POA: Diagnosis not present

## 2020-05-16 DIAGNOSIS — E782 Mixed hyperlipidemia: Secondary | ICD-10-CM | POA: Diagnosis not present

## 2020-05-16 DIAGNOSIS — K219 Gastro-esophageal reflux disease without esophagitis: Secondary | ICD-10-CM | POA: Diagnosis not present

## 2020-05-16 DIAGNOSIS — Z0189 Encounter for other specified special examinations: Secondary | ICD-10-CM | POA: Diagnosis not present

## 2020-05-16 DIAGNOSIS — Z681 Body mass index (BMI) 19 or less, adult: Secondary | ICD-10-CM | POA: Diagnosis not present

## 2020-05-16 DIAGNOSIS — F17200 Nicotine dependence, unspecified, uncomplicated: Secondary | ICD-10-CM | POA: Diagnosis not present

## 2020-05-16 DIAGNOSIS — M810 Age-related osteoporosis without current pathological fracture: Secondary | ICD-10-CM | POA: Diagnosis not present

## 2020-05-16 DIAGNOSIS — Z712 Person consulting for explanation of examination or test findings: Secondary | ICD-10-CM | POA: Diagnosis not present

## 2020-05-22 DIAGNOSIS — F17200 Nicotine dependence, unspecified, uncomplicated: Secondary | ICD-10-CM | POA: Diagnosis not present

## 2020-05-22 DIAGNOSIS — E782 Mixed hyperlipidemia: Secondary | ICD-10-CM | POA: Diagnosis not present

## 2020-05-22 DIAGNOSIS — M810 Age-related osteoporosis without current pathological fracture: Secondary | ICD-10-CM | POA: Diagnosis not present

## 2020-05-22 DIAGNOSIS — R945 Abnormal results of liver function studies: Secondary | ICD-10-CM | POA: Diagnosis not present

## 2020-05-22 DIAGNOSIS — K219 Gastro-esophageal reflux disease without esophagitis: Secondary | ICD-10-CM | POA: Diagnosis not present

## 2020-05-22 DIAGNOSIS — Z0001 Encounter for general adult medical examination with abnormal findings: Secondary | ICD-10-CM | POA: Diagnosis not present

## 2020-05-22 DIAGNOSIS — F419 Anxiety disorder, unspecified: Secondary | ICD-10-CM | POA: Diagnosis not present

## 2020-08-29 DIAGNOSIS — Z0001 Encounter for general adult medical examination with abnormal findings: Secondary | ICD-10-CM | POA: Diagnosis not present

## 2020-08-29 DIAGNOSIS — Z716 Tobacco abuse counseling: Secondary | ICD-10-CM | POA: Diagnosis not present

## 2020-08-29 DIAGNOSIS — F17200 Nicotine dependence, unspecified, uncomplicated: Secondary | ICD-10-CM | POA: Diagnosis not present

## 2020-08-29 DIAGNOSIS — Z0189 Encounter for other specified special examinations: Secondary | ICD-10-CM | POA: Diagnosis not present

## 2020-08-29 DIAGNOSIS — M810 Age-related osteoporosis without current pathological fracture: Secondary | ICD-10-CM | POA: Diagnosis not present

## 2020-08-29 DIAGNOSIS — Z0000 Encounter for general adult medical examination without abnormal findings: Secondary | ICD-10-CM | POA: Diagnosis not present

## 2020-08-29 DIAGNOSIS — K219 Gastro-esophageal reflux disease without esophagitis: Secondary | ICD-10-CM | POA: Diagnosis not present

## 2020-08-29 DIAGNOSIS — E782 Mixed hyperlipidemia: Secondary | ICD-10-CM | POA: Diagnosis not present

## 2020-08-29 DIAGNOSIS — Z712 Person consulting for explanation of examination or test findings: Secondary | ICD-10-CM | POA: Diagnosis not present

## 2020-08-29 DIAGNOSIS — Z681 Body mass index (BMI) 19 or less, adult: Secondary | ICD-10-CM | POA: Diagnosis not present

## 2020-09-03 DIAGNOSIS — R945 Abnormal results of liver function studies: Secondary | ICD-10-CM | POA: Diagnosis not present

## 2020-09-03 DIAGNOSIS — F17200 Nicotine dependence, unspecified, uncomplicated: Secondary | ICD-10-CM | POA: Diagnosis not present

## 2020-09-03 DIAGNOSIS — E782 Mixed hyperlipidemia: Secondary | ICD-10-CM | POA: Diagnosis not present

## 2020-09-03 DIAGNOSIS — Z716 Tobacco abuse counseling: Secondary | ICD-10-CM | POA: Diagnosis not present

## 2020-09-03 DIAGNOSIS — K219 Gastro-esophageal reflux disease without esophagitis: Secondary | ICD-10-CM | POA: Diagnosis not present

## 2020-09-03 DIAGNOSIS — M810 Age-related osteoporosis without current pathological fracture: Secondary | ICD-10-CM | POA: Diagnosis not present

## 2020-09-03 DIAGNOSIS — F419 Anxiety disorder, unspecified: Secondary | ICD-10-CM | POA: Diagnosis not present

## 2020-09-03 DIAGNOSIS — Z0001 Encounter for general adult medical examination with abnormal findings: Secondary | ICD-10-CM | POA: Diagnosis not present

## 2020-09-12 DIAGNOSIS — M810 Age-related osteoporosis without current pathological fracture: Secondary | ICD-10-CM | POA: Diagnosis not present

## 2020-09-12 DIAGNOSIS — Z681 Body mass index (BMI) 19 or less, adult: Secondary | ICD-10-CM | POA: Diagnosis not present

## 2020-09-12 DIAGNOSIS — E7849 Other hyperlipidemia: Secondary | ICD-10-CM | POA: Diagnosis not present

## 2020-09-12 DIAGNOSIS — Z0189 Encounter for other specified special examinations: Secondary | ICD-10-CM | POA: Diagnosis not present

## 2020-09-12 DIAGNOSIS — K219 Gastro-esophageal reflux disease without esophagitis: Secondary | ICD-10-CM | POA: Diagnosis not present

## 2020-10-15 DIAGNOSIS — Z681 Body mass index (BMI) 19 or less, adult: Secondary | ICD-10-CM | POA: Diagnosis not present

## 2020-10-15 DIAGNOSIS — M810 Age-related osteoporosis without current pathological fracture: Secondary | ICD-10-CM | POA: Diagnosis not present

## 2020-10-15 DIAGNOSIS — K219 Gastro-esophageal reflux disease without esophagitis: Secondary | ICD-10-CM | POA: Diagnosis not present

## 2020-10-15 DIAGNOSIS — E7849 Other hyperlipidemia: Secondary | ICD-10-CM | POA: Diagnosis not present

## 2020-10-15 DIAGNOSIS — Z0189 Encounter for other specified special examinations: Secondary | ICD-10-CM | POA: Diagnosis not present

## 2020-11-19 DIAGNOSIS — M25522 Pain in left elbow: Secondary | ICD-10-CM | POA: Diagnosis not present

## 2021-01-22 DIAGNOSIS — R1010 Upper abdominal pain, unspecified: Secondary | ICD-10-CM | POA: Diagnosis not present

## 2021-01-22 DIAGNOSIS — R110 Nausea: Secondary | ICD-10-CM | POA: Diagnosis not present

## 2021-02-01 ENCOUNTER — Other Ambulatory Visit: Payer: Self-pay | Admitting: Nurse Practitioner

## 2021-03-07 DIAGNOSIS — E782 Mixed hyperlipidemia: Secondary | ICD-10-CM | POA: Diagnosis not present

## 2021-03-07 DIAGNOSIS — Z712 Person consulting for explanation of examination or test findings: Secondary | ICD-10-CM | POA: Diagnosis not present

## 2021-03-07 DIAGNOSIS — E7849 Other hyperlipidemia: Secondary | ICD-10-CM | POA: Diagnosis not present

## 2021-03-07 DIAGNOSIS — Z681 Body mass index (BMI) 19 or less, adult: Secondary | ICD-10-CM | POA: Diagnosis not present

## 2021-03-12 DIAGNOSIS — F17200 Nicotine dependence, unspecified, uncomplicated: Secondary | ICD-10-CM | POA: Diagnosis not present

## 2021-03-12 DIAGNOSIS — K219 Gastro-esophageal reflux disease without esophagitis: Secondary | ICD-10-CM | POA: Diagnosis not present

## 2021-03-12 DIAGNOSIS — M810 Age-related osteoporosis without current pathological fracture: Secondary | ICD-10-CM | POA: Diagnosis not present

## 2021-03-12 DIAGNOSIS — Z716 Tobacco abuse counseling: Secondary | ICD-10-CM | POA: Diagnosis not present

## 2021-03-12 DIAGNOSIS — R945 Abnormal results of liver function studies: Secondary | ICD-10-CM | POA: Diagnosis not present

## 2021-03-12 DIAGNOSIS — E782 Mixed hyperlipidemia: Secondary | ICD-10-CM | POA: Diagnosis not present

## 2021-03-12 DIAGNOSIS — F419 Anxiety disorder, unspecified: Secondary | ICD-10-CM | POA: Diagnosis not present

## 2021-03-13 ENCOUNTER — Other Ambulatory Visit (HOSPITAL_COMMUNITY): Payer: Self-pay | Admitting: Internal Medicine

## 2021-03-13 DIAGNOSIS — Z13820 Encounter for screening for osteoporosis: Secondary | ICD-10-CM

## 2021-03-13 DIAGNOSIS — Z1231 Encounter for screening mammogram for malignant neoplasm of breast: Secondary | ICD-10-CM

## 2021-03-22 ENCOUNTER — Ambulatory Visit (HOSPITAL_COMMUNITY)
Admission: RE | Admit: 2021-03-22 | Discharge: 2021-03-22 | Disposition: A | Discharge status: Home / Self Care | Payer: PPO | Source: Ambulatory Visit | Attending: Internal Medicine | Admitting: Internal Medicine

## 2021-03-22 ENCOUNTER — Other Ambulatory Visit: Payer: Self-pay

## 2021-03-22 DIAGNOSIS — Z780 Asymptomatic menopausal state: Secondary | ICD-10-CM | POA: Insufficient documentation

## 2021-03-22 DIAGNOSIS — Z13820 Encounter for screening for osteoporosis: Secondary | ICD-10-CM

## 2021-03-22 DIAGNOSIS — M810 Age-related osteoporosis without current pathological fracture: Secondary | ICD-10-CM | POA: Diagnosis not present

## 2021-03-22 DIAGNOSIS — Z1231 Encounter for screening mammogram for malignant neoplasm of breast: Secondary | ICD-10-CM | POA: Insufficient documentation

## 2021-03-22 DIAGNOSIS — M8589 Other specified disorders of bone density and structure, multiple sites: Secondary | ICD-10-CM | POA: Diagnosis not present

## 2021-09-05 DIAGNOSIS — M810 Age-related osteoporosis without current pathological fracture: Secondary | ICD-10-CM | POA: Diagnosis not present

## 2021-09-05 DIAGNOSIS — Z716 Tobacco abuse counseling: Secondary | ICD-10-CM | POA: Diagnosis not present

## 2021-09-05 DIAGNOSIS — E7849 Other hyperlipidemia: Secondary | ICD-10-CM | POA: Diagnosis not present

## 2021-09-05 DIAGNOSIS — Z0189 Encounter for other specified special examinations: Secondary | ICD-10-CM | POA: Diagnosis not present

## 2021-09-05 DIAGNOSIS — Z0000 Encounter for general adult medical examination without abnormal findings: Secondary | ICD-10-CM | POA: Diagnosis not present

## 2021-09-05 DIAGNOSIS — F17200 Nicotine dependence, unspecified, uncomplicated: Secondary | ICD-10-CM | POA: Diagnosis not present

## 2021-09-05 DIAGNOSIS — K219 Gastro-esophageal reflux disease without esophagitis: Secondary | ICD-10-CM | POA: Diagnosis not present

## 2021-09-05 DIAGNOSIS — Z0001 Encounter for general adult medical examination with abnormal findings: Secondary | ICD-10-CM | POA: Diagnosis not present

## 2021-09-05 DIAGNOSIS — E782 Mixed hyperlipidemia: Secondary | ICD-10-CM | POA: Diagnosis not present

## 2021-09-05 DIAGNOSIS — Z681 Body mass index (BMI) 19 or less, adult: Secondary | ICD-10-CM | POA: Diagnosis not present

## 2021-09-05 DIAGNOSIS — Z712 Person consulting for explanation of examination or test findings: Secondary | ICD-10-CM | POA: Diagnosis not present

## 2021-09-09 DIAGNOSIS — M810 Age-related osteoporosis without current pathological fracture: Secondary | ICD-10-CM | POA: Diagnosis not present

## 2021-09-09 DIAGNOSIS — Z1211 Encounter for screening for malignant neoplasm of colon: Secondary | ICD-10-CM | POA: Diagnosis not present

## 2021-09-09 DIAGNOSIS — F17200 Nicotine dependence, unspecified, uncomplicated: Secondary | ICD-10-CM | POA: Diagnosis not present

## 2021-09-09 DIAGNOSIS — K219 Gastro-esophageal reflux disease without esophagitis: Secondary | ICD-10-CM | POA: Diagnosis not present

## 2021-09-09 DIAGNOSIS — F419 Anxiety disorder, unspecified: Secondary | ICD-10-CM | POA: Diagnosis not present

## 2021-09-09 DIAGNOSIS — Z0001 Encounter for general adult medical examination with abnormal findings: Secondary | ICD-10-CM | POA: Diagnosis not present

## 2021-09-09 DIAGNOSIS — R7301 Impaired fasting glucose: Secondary | ICD-10-CM | POA: Diagnosis not present

## 2021-09-09 DIAGNOSIS — R945 Abnormal results of liver function studies: Secondary | ICD-10-CM | POA: Diagnosis not present

## 2021-09-09 DIAGNOSIS — E782 Mixed hyperlipidemia: Secondary | ICD-10-CM | POA: Diagnosis not present

## 2021-11-26 DIAGNOSIS — Z1211 Encounter for screening for malignant neoplasm of colon: Secondary | ICD-10-CM | POA: Diagnosis not present

## 2022-01-10 DIAGNOSIS — Z1211 Encounter for screening for malignant neoplasm of colon: Secondary | ICD-10-CM | POA: Diagnosis not present

## 2022-02-12 ENCOUNTER — Encounter: Payer: Self-pay | Admitting: *Deleted

## 2022-03-06 DIAGNOSIS — E782 Mixed hyperlipidemia: Secondary | ICD-10-CM | POA: Diagnosis not present

## 2022-03-06 DIAGNOSIS — R7301 Impaired fasting glucose: Secondary | ICD-10-CM | POA: Diagnosis not present

## 2022-03-10 DIAGNOSIS — F17200 Nicotine dependence, unspecified, uncomplicated: Secondary | ICD-10-CM | POA: Diagnosis not present

## 2022-03-10 DIAGNOSIS — R7301 Impaired fasting glucose: Secondary | ICD-10-CM | POA: Diagnosis not present

## 2022-03-10 DIAGNOSIS — R945 Abnormal results of liver function studies: Secondary | ICD-10-CM | POA: Diagnosis not present

## 2022-03-10 DIAGNOSIS — M810 Age-related osteoporosis without current pathological fracture: Secondary | ICD-10-CM | POA: Diagnosis not present

## 2022-03-10 DIAGNOSIS — E782 Mixed hyperlipidemia: Secondary | ICD-10-CM | POA: Diagnosis not present

## 2022-03-10 DIAGNOSIS — K219 Gastro-esophageal reflux disease without esophagitis: Secondary | ICD-10-CM | POA: Diagnosis not present

## 2022-04-15 ENCOUNTER — Ambulatory Visit: Payer: PPO

## 2022-04-23 ENCOUNTER — Ambulatory Visit (INDEPENDENT_AMBULATORY_CARE_PROVIDER_SITE_OTHER): Payer: Self-pay | Admitting: *Deleted

## 2022-04-23 VITALS — Ht <= 58 in | Wt 102.0 lb

## 2022-04-23 DIAGNOSIS — Z86010 Personal history of colonic polyps: Secondary | ICD-10-CM

## 2022-04-23 NOTE — Progress Notes (Signed)
Spoke to pt.  She was informed ov needed due to positive cologuard.  Scheduled ov for 05/01/2022 at 10:30 with Venetia Night, NP. ?

## 2022-04-23 NOTE — Progress Notes (Signed)
Positive cologuard in January 2023. Recommend OV.  ?

## 2022-04-23 NOTE — Progress Notes (Signed)
Gastroenterology Pre-Procedure Review ? ?Request Date: 04/23/2022 ?Requesting Physician: Dr. Nita Sells, Last TCS done 03/15/2010 by Dr. Darrick Penna, large internal hemorrhoids, tubular adenoma, 10 year repeat recommended, positive cologuard 01/10/2022 ? ?PATIENT REVIEW QUESTIONS: The patient responded to the following health history questions as indicated:   ? ?1. Diabetes Melitis: no ?2. Joint replacements in the past 12 months: no ?3. Major health problems in the past 3 months: no ?4. Has an artificial valve or MVP: no ?5. Has a defibrillator: no ?6. Has been advised in past to take antibiotics in advance of a procedure like teeth cleaning: no ?7. Family history of colon cancer: no  ?8. Alcohol Use: no ?9. Illicit drug Use: no ?10. History of sleep apnea: no  ?11. History of coronary artery or other vascular stents placed within the last 12 months: no ?12. History of any prior anesthesia complications: no ?13. Body mass index is 21.32 kg/m?. ?   ?MEDICATIONS & ALLERGIES:    ?Patient reports the following regarding taking any blood thinners:   ?Plavix? no ?Aspirin? no ?Coumadin? no ?Brilinta? no ?Xarelto? no ?Eliquis? no ?Pradaxa? no ?Savaysa? no ?Effient? no ? ?Patient confirms/reports the following medications:  ?Current Outpatient Medications  ?Medication Sig Dispense Refill  ? atorvastatin (LIPITOR) 40 MG tablet Take 40 mg by mouth at bedtime.    ? pantoprazole (PROTONIX) 20 MG tablet Take 1 tablet (20 mg total) by mouth daily. (Patient taking differently: Take 20 mg by mouth as needed.) 30 tablet 2  ? ?No current facility-administered medications for this visit.  ? ? ?Patient confirms/reports the following allergies:  ?No Known Allergies ? ?No orders of the defined types were placed in this encounter. ? ? ?AUTHORIZATION INFORMATION ?Primary Insurance: [facility],  ID #: [id number],  Group #: [id number] ?Pre-Cert / Berkley Harvey required: No, not required ? ?SCHEDULE INFORMATION: ?Procedure has been scheduled as follows:   ?Date: , Time:   ?Location: [facility] with Dr. Marletta Lor ? ?This Gastroenterology Pre-Precedure Review Form is being routed to the following provider(s): Ermalinda Memos, PA-C ?  ?

## 2022-04-29 ENCOUNTER — Encounter: Payer: Self-pay | Admitting: Gastroenterology

## 2022-04-29 NOTE — Progress Notes (Signed)
  GI Office Note    Referring Provider: Hall, John Z, MD Primary Care Physician:  Hall, John Z, MD  Primary GI: Dr. Carver  Chief Complaint   Chief Complaint  Patient presents with   Colonoscopy    Positive cologuard      History of Present Illness   Laura Lamb is a 71 y.o. female presenting today at the request of Hall, John Z, MD for surveillance colonoscopy after positive Cologuard.   Last TCS by Dr. Fields March 2011 - extremely tortuous colon, 3 mm sessile rectal polyp, two 2mm sessile rectal polyps near anal verge, one 4mm adenomatous appearing polyp, large internal hemorrhoids (path - tubular adenoma and hyperplastic). Dr. Fields recommended next colonoscopy with peds scope.    Today:  GERD - Not really taking pantoprazole. Has occasional indigestion or stomach discomfort from time to time but not on a regular basis. Tums or Rolaids as she needs it. Just occasional burping.   Had blood on the toilet tissue after getting her stool sample. Did strain a little with that sample. Used to be regular. Moves easy, usually going everyday. Feels like complete emptying. Has not seen more blood on the toilet tissue.  Years ago had hemorrhoids and had them cut out (about 25 years).   Denies abdominal pain, melena, hematochezia, dysphagia, N/V, unintentional weight loss, lack of appetite, early satiety.     Past Medical History:  Diagnosis Date   Hyperlipidemia     Past Surgical History:  Procedure Laterality Date   CESAREAN SECTION     COLONOSCOPY  03/15/2010   Dr. Fields: hemorrhoids and rectal polyp (tubular adenoma and hyperplastic)   TUBAL LIGATION      Current Outpatient Medications  Medication Sig Dispense Refill   atorvastatin (LIPITOR) 40 MG tablet Take 40 mg by mouth at bedtime.     No current facility-administered medications for this visit.    Allergies as of 05/01/2022   (No Known Allergies)    Family History  Problem Relation Age of Onset    Atrial fibrillation Mother    Colon polyps Sister    Heart disease Sister    Colon cancer Neg Hx     Social History   Socioeconomic History   Marital status: Married    Spouse name: Not on file   Number of children: Not on file   Years of education: Not on file   Highest education level: Not on file  Occupational History   Not on file  Tobacco Use   Smoking status: Every Day    Packs/day: 1.00    Types: Cigarettes   Smokeless tobacco: Never  Vaping Use   Vaping Use: Never used  Substance and Sexual Activity   Alcohol use: Never   Drug use: Never   Sexual activity: Not on file  Other Topics Concern   Not on file  Social History Narrative   Not on file   Social Determinants of Health   Financial Resource Strain: Not on file  Food Insecurity: Not on file  Transportation Needs: Not on file  Physical Activity: Not on file  Stress: Not on file  Social Connections: Not on file  Intimate Partner Violence: Not on file     Review of Systems   Gen: Denies any fever, chills, fatigue, weight loss, lack of appetite.  CV: Denies chest pain, heart palpitations, peripheral edema, syncope.  Resp: Denies shortness of breath at rest or with exertion. Denies wheezing or cough.  GI:   see HPI GU : Denies urinary burning, urinary frequency, urinary hesitancy MS: Denies joint pain, muscle weakness, cramps, or limitation of movement.  Derm: Denies rash, itching, dry skin Psych: Denies depression, anxiety, memory loss, and confusion Heme: Denies bruising, bleeding, and enlarged lymph nodes.   Physical Exam   BP 126/74   Pulse 94   Temp 98 F (36.7 C) (Temporal)   Ht 4' 10" (1.473 m)   Wt 103 lb 9.6 oz (47 kg)   BMI 21.65 kg/m   General:   Alert and oriented. Pleasant and cooperative. Well-nourished and well-developed.  Head:  Normocephalic and atraumatic. Eyes:  Without icterus, sclera clear and conjunctiva pink.  Ears:  Normal auditory acuity. Mouth:  No deformity or  lesions, oral mucosa pink.  Lungs:  Clear to auscultation bilaterally. No wheezes, rales, or rhonchi. No distress.  Heart:  S1, S2 present without murmurs appreciated.  Abdomen:  +BS, soft, non-tender and non-distended. No HSM noted. No guarding or rebound. No masses appreciated.  Rectal:  Deferred  Msk:  Symmetrical without gross deformities. Normal posture. Extremities:  Without edema. Neurologic:  Alert and  oriented x4;  grossly normal neurologically. Skin:  Intact without significant lesions or rashes. Psych:  Alert and cooperative. Normal mood and affect.   Assessment   Laura Lamb is a 71 y.o. female with a history of hemorrhoids and hyperlipidemia presenting today to schedule colonoscopy after positive Cologuard.   History of colon polyps: Last colonoscopy in 2011 with 4 polyps (tubular adenoma and hyperplastic).  Overdue for colonoscopy.  Recently had positive Cologuard in January 2023.  No alarm symptoms present.  Has had 1 episode of toilet tissue hematochezia after straining.  Does have a history of hemorrhoids s/p surgical removal about 25 years ago.  Reports that she has daily bowel movements and only occasionally has straining.  Denies incomplete defecation.  Denies melena or overt hematochezia, abdominal pain, nausea/vomiting, dysphagia.  Cologuard results discussed with patient, advised that with prior history of colon polyps and possible internal hemorrhoids this likely could have caused positive Cologuard results.  With history of colon polyps the best method of surveillance for colon cancer is via colonoscopy.  We will proceed with surveillance colonoscopy with propofol with Dr. Carver in the near future.  Dyspepsia: Has occasional symptoms controlled with Tums or Rolaids.  Denies dysphagia.  Has history of reflux previously on pantoprazole and was only taking as needed due to epigastric discomfort.   PLAN   Proceed with colonoscopy by Dr. Carver in near future: the  risks, benefits, and alternatives have been discussed with the patient in detail. The patient states understanding and desires to proceed. ASA 2 Continue Tums or Rolaids as needed    Sura Canul, MSN, FNP-BC, AGACNP-BC [facility] 

## 2022-04-29 NOTE — H&P (View-Only) (Signed)
GI Office Note    Referring Provider: Benita Stabile, MD Primary Care Physician:  Benita Stabile, MD  Primary GI: Dr. Marletta Lor  Chief Complaint   Chief Complaint  Patient presents with   Colonoscopy    Positive cologuard      History of Present Illness   Laura Lamb is a 71 y.o. female presenting today at the request of Benita Stabile, MD for surveillance colonoscopy after positive Cologuard.   Last TCS by Dr. Darrick Penna March 2011 - extremely tortuous colon, 3 mm sessile rectal polyp, two 30mm sessile rectal polyps near anal verge, one 54mm adenomatous appearing polyp, large internal hemorrhoids (path - tubular adenoma and hyperplastic). Dr. Darrick Penna recommended next colonoscopy with peds scope.    Today:  GERD - Not really taking pantoprazole. Has occasional indigestion or stomach discomfort from time to time but not on a regular basis. Tums or Rolaids as she needs it. Just occasional burping.   Had blood on the toilet tissue after getting her stool sample. Did strain a little with that sample. Used to be regular. Moves easy, usually going everyday. Feels like complete emptying. Has not seen more blood on the toilet tissue.  Years ago had hemorrhoids and had them cut out (about 25 years).   Denies abdominal pain, melena, hematochezia, dysphagia, N/V, unintentional weight loss, lack of appetite, early satiety.     Past Medical History:  Diagnosis Date   Hyperlipidemia     Past Surgical History:  Procedure Laterality Date   CESAREAN SECTION     COLONOSCOPY  03/15/2010   Dr. Darrick Penna: hemorrhoids and rectal polyp (tubular adenoma and hyperplastic)   TUBAL LIGATION      Current Outpatient Medications  Medication Sig Dispense Refill   atorvastatin (LIPITOR) 40 MG tablet Take 40 mg by mouth at bedtime.     No current facility-administered medications for this visit.    Allergies as of 05/01/2022   (No Known Allergies)    Family History  Problem Relation Age of Onset    Atrial fibrillation Mother    Colon polyps Sister    Heart disease Sister    Colon cancer Neg Hx     Social History   Socioeconomic History   Marital status: Married    Spouse name: Not on file   Number of children: Not on file   Years of education: Not on file   Highest education level: Not on file  Occupational History   Not on file  Tobacco Use   Smoking status: Every Day    Packs/day: 1.00    Types: Cigarettes   Smokeless tobacco: Never  Vaping Use   Vaping Use: Never used  Substance and Sexual Activity   Alcohol use: Never   Drug use: Never   Sexual activity: Not on file  Other Topics Concern   Not on file  Social History Narrative   Not on file   Social Determinants of Health   Financial Resource Strain: Not on file  Food Insecurity: Not on file  Transportation Needs: Not on file  Physical Activity: Not on file  Stress: Not on file  Social Connections: Not on file  Intimate Partner Violence: Not on file     Review of Systems   Gen: Denies any fever, chills, fatigue, weight loss, lack of appetite.  CV: Denies chest pain, heart palpitations, peripheral edema, syncope.  Resp: Denies shortness of breath at rest or with exertion. Denies wheezing or cough.  GI:  see HPI GU : Denies urinary burning, urinary frequency, urinary hesitancy MS: Denies joint pain, muscle weakness, cramps, or limitation of movement.  Derm: Denies rash, itching, dry skin Psych: Denies depression, anxiety, memory loss, and confusion Heme: Denies bruising, bleeding, and enlarged lymph nodes.   Physical Exam   BP 126/74   Pulse 94   Temp 98 F (36.7 C) (Temporal)   Ht 4\' 10"  (1.473 m)   Wt 103 lb 9.6 oz (47 kg)   BMI 21.65 kg/m   General:   Alert and oriented. Pleasant and cooperative. Well-nourished and well-developed.  Head:  Normocephalic and atraumatic. Eyes:  Without icterus, sclera clear and conjunctiva pink.  Ears:  Normal auditory acuity. Mouth:  No deformity or  lesions, oral mucosa pink.  Lungs:  Clear to auscultation bilaterally. No wheezes, rales, or rhonchi. No distress.  Heart:  S1, S2 present without murmurs appreciated.  Abdomen:  +BS, soft, non-tender and non-distended. No HSM noted. No guarding or rebound. No masses appreciated.  Rectal:  Deferred  Msk:  Symmetrical without gross deformities. Normal posture. Extremities:  Without edema. Neurologic:  Alert and  oriented x4;  grossly normal neurologically. Skin:  Intact without significant lesions or rashes. Psych:  Alert and cooperative. Normal mood and affect.   Assessment   Laura Lamb is a 71 y.o. female with a history of hemorrhoids and hyperlipidemia presenting today to schedule colonoscopy after positive Cologuard.   History of colon polyps: Last colonoscopy in 2011 with 4 polyps (tubular adenoma and hyperplastic).  Overdue for colonoscopy.  Recently had positive Cologuard in January 2023.  No alarm symptoms present.  Has had 1 episode of toilet tissue hematochezia after straining.  Does have a history of hemorrhoids s/p surgical removal about 25 years ago.  Reports that she has daily bowel movements and only occasionally has straining.  Denies incomplete defecation.  Denies melena or overt hematochezia, abdominal pain, nausea/vomiting, dysphagia.  Cologuard results discussed with patient, advised that with prior history of colon polyps and possible internal hemorrhoids this likely could have caused positive Cologuard results.  With history of colon polyps the best method of surveillance for colon cancer is via colonoscopy.  We will proceed with surveillance colonoscopy with propofol with Dr. February 2023 in the near future.  Dyspepsia: Has occasional symptoms controlled with Tums or Rolaids.  Denies dysphagia.  Has history of reflux previously on pantoprazole and was only taking as needed due to epigastric discomfort.   PLAN   Proceed with colonoscopy by Dr. Marletta Lor in near future: the  risks, benefits, and alternatives have been discussed with the patient in detail. The patient states understanding and desires to proceed. ASA 2 Continue Tums or Rolaids as needed    Marletta Lor, MSN, FNP-BC, AGACNP-BC [facility]

## 2022-05-01 ENCOUNTER — Ambulatory Visit (INDEPENDENT_AMBULATORY_CARE_PROVIDER_SITE_OTHER): Payer: PPO | Admitting: Gastroenterology

## 2022-05-01 ENCOUNTER — Encounter: Payer: Self-pay | Admitting: Gastroenterology

## 2022-05-01 ENCOUNTER — Other Ambulatory Visit: Payer: Self-pay

## 2022-05-01 VITALS — BP 126/74 | HR 94 | Temp 98.0°F | Ht <= 58 in | Wt 103.6 lb

## 2022-05-01 DIAGNOSIS — R195 Other fecal abnormalities: Secondary | ICD-10-CM | POA: Diagnosis not present

## 2022-05-01 DIAGNOSIS — R1013 Epigastric pain: Secondary | ICD-10-CM

## 2022-05-01 DIAGNOSIS — Z86010 Personal history of colonic polyps: Secondary | ICD-10-CM

## 2022-05-01 MED ORDER — PEG 3350-KCL-NA BICARB-NACL 420 G PO SOLR: 4000.0000 mL | ORAL | 0 refills | Status: DC

## 2022-05-01 NOTE — Patient Instructions (Signed)
We are scheduling you for a colonoscopy with Dr. Marletta Lor in the near future. ? ?It was a pleasure to meet you today.  ? ?Continue tums or rolaids as needed for your reflux. Feel free to reach out if you have any concerns.  ? ?It was a pleasure to see you today. I want to create trusting relationships with patients. If you receive a survey regarding your visit,  I greatly appreciate you taking time to fill this out on paper or through your MyChart. I value your feedback. ? ?Brooke Bonito, MSN, FNP-BC, AGACNP-BC ?[facility] ? ? ?

## 2022-05-30 ENCOUNTER — Encounter (HOSPITAL_COMMUNITY): Payer: Self-pay

## 2022-05-30 ENCOUNTER — Ambulatory Visit (HOSPITAL_COMMUNITY): Payer: PPO | Admitting: Certified Registered Nurse Anesthetist

## 2022-05-30 ENCOUNTER — Ambulatory Visit (HOSPITAL_COMMUNITY)
Admission: RE | Admit: 2022-05-30 | Discharge: 2022-05-30 | Disposition: A | Discharge status: Home / Self Care | Payer: PPO | Attending: Internal Medicine | Admitting: Internal Medicine

## 2022-05-30 ENCOUNTER — Other Ambulatory Visit: Payer: Self-pay

## 2022-05-30 ENCOUNTER — Encounter (HOSPITAL_COMMUNITY)
Admission: RE | Disposition: A | Discharge status: Home / Self Care | Payer: Self-pay | Source: Home / Self Care | Attending: Internal Medicine

## 2022-05-30 ENCOUNTER — Ambulatory Visit (HOSPITAL_BASED_OUTPATIENT_CLINIC_OR_DEPARTMENT_OTHER): Payer: PPO | Admitting: Certified Registered Nurse Anesthetist

## 2022-05-30 DIAGNOSIS — K649 Unspecified hemorrhoids: Secondary | ICD-10-CM

## 2022-05-30 DIAGNOSIS — Z1211 Encounter for screening for malignant neoplasm of colon: Secondary | ICD-10-CM | POA: Diagnosis not present

## 2022-05-30 DIAGNOSIS — K219 Gastro-esophageal reflux disease without esophagitis: Secondary | ICD-10-CM | POA: Insufficient documentation

## 2022-05-30 DIAGNOSIS — R195 Other fecal abnormalities: Secondary | ICD-10-CM | POA: Insufficient documentation

## 2022-05-30 DIAGNOSIS — Z79899 Other long term (current) drug therapy: Secondary | ICD-10-CM | POA: Diagnosis not present

## 2022-05-30 DIAGNOSIS — K5730 Diverticulosis of large intestine without perforation or abscess without bleeding: Secondary | ICD-10-CM

## 2022-05-30 DIAGNOSIS — E785 Hyperlipidemia, unspecified: Secondary | ICD-10-CM | POA: Insufficient documentation

## 2022-05-30 DIAGNOSIS — K635 Polyp of colon: Secondary | ICD-10-CM | POA: Diagnosis not present

## 2022-05-30 DIAGNOSIS — F17210 Nicotine dependence, cigarettes, uncomplicated: Secondary | ICD-10-CM | POA: Diagnosis not present

## 2022-05-30 DIAGNOSIS — K648 Other hemorrhoids: Secondary | ICD-10-CM | POA: Diagnosis not present

## 2022-05-30 DIAGNOSIS — Z86010 Personal history of colonic polyps: Secondary | ICD-10-CM | POA: Diagnosis not present

## 2022-05-30 HISTORY — PX: COLONOSCOPY WITH PROPOFOL: SHX5780

## 2022-05-30 HISTORY — PX: POLYPECTOMY: SHX5525

## 2022-05-30 SURGERY — COLONOSCOPY WITH PROPOFOL
Anesthesia: General

## 2022-05-30 MED ORDER — LIDOCAINE HCL (CARDIAC) PF 100 MG/5ML IV SOSY
PREFILLED_SYRINGE | INTRAVENOUS | Status: DC | PRN
  Administered 2022-05-30: 60 mg via INTRATRACHEAL

## 2022-05-30 MED ORDER — LACTATED RINGERS IV SOLN: INTRAVENOUS | Status: DC | PRN

## 2022-05-30 MED ORDER — PROPOFOL 10 MG/ML IV BOLUS
INTRAVENOUS | Status: DC | PRN
  Administered 2022-05-30: 70 mg via INTRAVENOUS

## 2022-05-30 MED ORDER — LACTATED RINGERS IV SOLN: INTRAVENOUS | Status: DC

## 2022-05-30 MED ORDER — PROPOFOL 500 MG/50ML IV EMUL
INTRAVENOUS | Status: DC | PRN
  Administered 2022-05-30: 180 ug/kg/min via INTRAVENOUS

## 2022-05-30 NOTE — Anesthesia Preprocedure Evaluation (Signed)
Anesthesia Evaluation  Patient identified by MRN, date of birth, ID band Patient awake    Reviewed: Allergy & Precautions, H&P , NPO status , Patient's Chart, lab work & pertinent test results, reviewed documented beta blocker date and time   Airway Mallampati: I  TM Distance: >3 FB Neck ROM: full    Dental no notable dental hx.    Pulmonary neg pulmonary ROS, Current Smoker,    Pulmonary exam normal breath sounds clear to auscultation       Cardiovascular Exercise Tolerance: Good negative cardio ROS   Rhythm:regular Rate:Normal     Neuro/Psych negative neurological ROS  negative psych ROS   GI/Hepatic negative GI ROS, Neg liver ROS,   Endo/Other  negative endocrine ROS  Renal/GU negative Renal ROS  negative genitourinary   Musculoskeletal   Abdominal   Peds  Hematology negative hematology ROS (+)   Anesthesia Other Findings   Reproductive/Obstetrics negative OB ROS                             Anesthesia Physical Anesthesia Plan  ASA: 2  Anesthesia Plan: General   Post-op Pain Management:    Induction:   PONV Risk Score and Plan: Propofol infusion  Airway Management Planned:   Additional Equipment:   Intra-op Plan:   Post-operative Plan:   Informed Consent: I have reviewed the patients History and Physical, chart, labs and discussed the procedure including the risks, benefits and alternatives for the proposed anesthesia with the patient or authorized representative who has indicated his/her understanding and acceptance.     Dental Advisory Given  Plan Discussed with: CRNA  Anesthesia Plan Comments:         Anesthesia Quick Evaluation

## 2022-05-30 NOTE — Interval H&P Note (Signed)
History and Physical Interval Note:  05/30/2022 8:43 AM  [name]  has presented today for surgery, with the diagnosis of history of colon polyps, positive cologuard.  The various methods of treatment have been discussed with the patient and family. After consideration of risks, benefits and other options for treatment, the patient has consented to  Procedure(s) with comments: COLONOSCOPY WITH PROPOFOL (N/A) - 10:00am as a surgical intervention.  The patient's history has been reviewed, patient examined, no change in status, stable for surgery.  I have reviewed the patient's chart and labs.  Questions were answered to the patient's satisfaction.     Lanelle Bal

## 2022-05-30 NOTE — Discharge Instructions (Signed)
  Colonoscopy Discharge Instructions  Read the instructions outlined below and refer to this sheet in the next few weeks. These discharge instructions provide you with general information on caring for yourself after you leave the hospital. Your doctor may also give you specific instructions. While your treatment has been planned according to the most current medical practices available, unavoidable complications occasionally occur.   ACTIVITY You may resume your regular activity, but move at a slower pace for the next 24 hours.  Take frequent rest periods for the next 24 hours.  Walking will help get rid of the air and reduce the bloated feeling in your belly (abdomen).  No driving for 24 hours (because of the medicine (anesthesia) used during the test).   Do not sign any important legal documents or operate any machinery for 24 hours (because of the anesthesia used during the test).  NUTRITION Drink plenty of fluids.  You may resume your normal diet as instructed by your doctor.  Begin with a light meal and progress to your normal diet. Heavy or fried foods are harder to digest and may make you feel sick to your stomach (nauseated).  Avoid alcoholic beverages for 24 hours or as instructed.  MEDICATIONS You may resume your normal medications unless your doctor tells you otherwise.  WHAT YOU CAN EXPECT TODAY Some feelings of bloating in the abdomen.  Passage of more gas than usual.  Spotting of blood in your stool or on the toilet paper.  IF YOU HAD POLYPS REMOVED DURING THE COLONOSCOPY: No aspirin products for 7 days or as instructed.  No alcohol for 7 days or as instructed.  Eat a soft diet for the next 24 hours.  FINDING OUT THE RESULTS OF YOUR TEST Not all test results are available during your visit. If your test results are not back during the visit, make an appointment with your caregiver to find out the results. Do not assume everything is normal if you have not heard from your  caregiver or the medical facility. It is important for you to follow up on all of your test results.  SEEK IMMEDIATE MEDICAL ATTENTION IF: You have more than a spotting of blood in your stool.  Your belly is swollen (abdominal distention).  You are nauseated or vomiting.  You have a temperature over 101.  You have abdominal pain or discomfort that is severe or gets worse throughout the day.   Your colonoscopy revealed 1 polyp(s) which I removed successfully. Await pathology results, my office will contact you. I recommend repeating colonoscopy in 5-10 years for surveillance purposes. You also have diverticulosis and internal hemorrhoids. I would recommend increasing fiber in your diet or adding OTC Benefiber/Metamucil. Be sure to drink at least 4 to 6 glasses of water daily. Follow-up with GI as needed.    I hope you have a great weekend and HAPPY BIRTHDAY  Mariaceleste Herrera K. Marletta Lor, D.O. Gastroenterology and Hepatology [facility]

## 2022-05-30 NOTE — Op Note (Signed)
[facility] Patient Name: Laura Lamb Procedure Date: 05/30/2022 8:53 AM MRN: [id number] Date of Birth: 03-Oct-1951 Attending MD: Elon Alas. Abbey Chatters DO CSN: [id number] Age: 71 Admit Type: Outpatient Procedure:                Colonoscopy Indications:              Positive Cologuard test Providers:                Elon Alas. Abbey Chatters, DO, Caprice Kluver, Raphael Gibney,                            Technician Referring MD:              Medicines:                See the Anesthesia note for documentation of the                            administered medications Complications:            No immediate complications. Estimated Blood Loss:     Estimated blood loss was minimal. Procedure:                Pre-Anesthesia Assessment:                           - The anesthesia plan was to use monitored                            anesthesia care (MAC).                           After obtaining informed consent, the colonoscope                            was passed under direct vision. Throughout the                            procedure, the patient's blood pressure, pulse, and                            oxygen saturations were monitored continuously. The                            [id number]-HQ190L ([id number]) scope was introduced through                            the anus and advanced to the the cecum, identified                            by appendiceal orifice and ileocecal valve. The                            colonoscopy was performed without difficulty. The                            patient tolerated the procedure well. The quality  of the bowel preparation was evaluated using the                            BBPS [facility] Bowel Preparation Scale) with scores                            of: Right Colon = 3, Transverse Colon = 3 and Left                            Colon = 3 (entire mucosa seen well with no residual                            staining, small fragments of stool or  opaque                            liquid). The total BBPS score equals 9. Scope In: 9:00:58 AM Scope Out: 9:14:16 AM Scope Withdrawal Time: 0 hours 8 minutes 37 seconds  Total Procedure Duration: 0 hours 13 minutes 18 seconds  Findings:      Hemorrhoids were found on perianal exam.      A few small-mouthed diverticula were found in the sigmoid colon.      A 4 mm polyp was found in the sigmoid colon. The polyp was sessile. The       polyp was removed with a cold snare. Resection and retrieval were       complete.      The exam was otherwise without abnormality. Impression:               - Hemorrhoids found on perianal exam.                           - Diverticulosis in the sigmoid colon.                           - One 4 mm polyp in the sigmoid colon, removed with                            a cold snare. Resected and retrieved.                           - The examination was otherwise normal. Moderate Sedation:      Per Anesthesia Care Recommendation:           - Patient has a contact number available for                            emergencies. The signs and symptoms of potential                            delayed complications were discussed with the                            patient. Return to normal activities tomorrow.  Written discharge instructions were provided to the                            patient.                           - Resume previous diet.                           - Continue present medications.                           - Await pathology results.                           - Repeat colonoscopy in 5-10 years for surveillance.                           - Return to GI clinic PRN. Procedure Code(s):        --- Professional ---                           [phone], Colonoscopy, flexible; with removal of                            tumor(s), polyp(s), or other lesion(s) by snare                            technique Diagnosis Code(s):        ---  Professional ---                           K64.9, Unspecified hemorrhoids                           K63.5, Polyp of colon                           R19.5, Other fecal abnormalities                           K57.30, Diverticulosis of large intestine without                            perforation or abscess without bleeding CPT copyright 2019 [employer] Association. All rights reserved. The codes documented in this report are preliminary and upon coder review may  be revised to meet current compliance requirements. Elon Alas. Abbey Chatters, DO Dale Abbey Chatters, DO 05/30/2022 9:17:39 AM This report has been signed electronically. Number of Addenda: 0

## 2022-05-30 NOTE — Anesthesia Postprocedure Evaluation (Signed)
Anesthesia Post Note  Patient: Laura Lamb  Procedure(s) Performed: COLONOSCOPY WITH PROPOFOL POLYPECTOMY  Patient location during evaluation: Phase II Anesthesia Type: General Level of consciousness: awake Pain management: pain level controlled Vital Signs Assessment: post-procedure vital signs reviewed and stable Respiratory status: spontaneous breathing and respiratory function stable Cardiovascular status: blood pressure returned to baseline and stable Postop Assessment: no headache and no apparent nausea or vomiting Anesthetic complications: no Comments: Late entry   No notable events documented.   Last Vitals:  Vitals:   05/30/22 0833 05/30/22 0917  BP: 123/74 (!) 110/95  Pulse: 86   Resp: 15 15  Temp: (!) 36.4 C 36.5 C  SpO2: 98% 97%    Last Pain:  Vitals:   05/30/22 0917  TempSrc: Oral  PainSc: 0-No pain                 Louann Sjogren

## 2022-05-30 NOTE — Transfer of Care (Signed)
Immediate Anesthesia Transfer of Care Note  Patient: Laura Lamb  Procedure(s) Performed: COLONOSCOPY WITH PROPOFOL POLYPECTOMY  Patient Location: PACU  Anesthesia Type:General  Level of Consciousness: awake, alert  and oriented  Airway & Oxygen Therapy: Patient Spontanous Breathing  Post-op Assessment: Report given to RN, Post -op Vital signs reviewed and stable and Patient moving all extremities X 4  Post vital signs: Reviewed and stable  Last Vitals:  Vitals Value Taken Time  BP 110/95 05/30/22 0917  Temp 36.5 C 05/30/22 0917  Pulse    Resp 15 05/30/22 0917  SpO2 97 % 05/30/22 0917    Last Pain:  Vitals:   05/30/22 0917  TempSrc: Oral  PainSc: 0-No pain         Complications: No notable events documented.

## 2022-06-02 LAB — SURGICAL PATHOLOGY

## 2022-06-06 ENCOUNTER — Encounter (HOSPITAL_COMMUNITY): Payer: Self-pay | Admitting: Internal Medicine

## 2022-09-05 DIAGNOSIS — R7301 Impaired fasting glucose: Secondary | ICD-10-CM | POA: Diagnosis not present

## 2022-09-05 DIAGNOSIS — E782 Mixed hyperlipidemia: Secondary | ICD-10-CM | POA: Diagnosis not present

## 2022-09-10 DIAGNOSIS — Z0001 Encounter for general adult medical examination with abnormal findings: Secondary | ICD-10-CM | POA: Diagnosis not present

## 2022-09-10 DIAGNOSIS — K219 Gastro-esophageal reflux disease without esophagitis: Secondary | ICD-10-CM | POA: Diagnosis not present

## 2022-09-10 DIAGNOSIS — M810 Age-related osteoporosis without current pathological fracture: Secondary | ICD-10-CM | POA: Diagnosis not present

## 2022-09-10 DIAGNOSIS — F17200 Nicotine dependence, unspecified, uncomplicated: Secondary | ICD-10-CM | POA: Diagnosis not present

## 2022-09-10 DIAGNOSIS — E782 Mixed hyperlipidemia: Secondary | ICD-10-CM | POA: Diagnosis not present

## 2022-09-10 DIAGNOSIS — R945 Abnormal results of liver function studies: Secondary | ICD-10-CM | POA: Diagnosis not present

## 2022-09-10 DIAGNOSIS — R7301 Impaired fasting glucose: Secondary | ICD-10-CM | POA: Diagnosis not present

## 2022-09-10 DIAGNOSIS — K635 Polyp of colon: Secondary | ICD-10-CM | POA: Diagnosis not present

## 2023-03-05 DIAGNOSIS — E782 Mixed hyperlipidemia: Secondary | ICD-10-CM | POA: Diagnosis not present

## 2023-03-05 DIAGNOSIS — R7301 Impaired fasting glucose: Secondary | ICD-10-CM | POA: Diagnosis not present

## 2023-03-10 DIAGNOSIS — Z0000 Encounter for general adult medical examination without abnormal findings: Secondary | ICD-10-CM | POA: Diagnosis not present

## 2023-03-11 ENCOUNTER — Other Ambulatory Visit (HOSPITAL_COMMUNITY): Payer: Self-pay | Admitting: Family Medicine

## 2023-03-11 DIAGNOSIS — F17200 Nicotine dependence, unspecified, uncomplicated: Secondary | ICD-10-CM | POA: Diagnosis not present

## 2023-03-11 DIAGNOSIS — Z6821 Body mass index (BMI) 21.0-21.9, adult: Secondary | ICD-10-CM | POA: Diagnosis not present

## 2023-03-11 DIAGNOSIS — M810 Age-related osteoporosis without current pathological fracture: Secondary | ICD-10-CM | POA: Diagnosis not present

## 2023-03-11 DIAGNOSIS — Z79899 Other long term (current) drug therapy: Secondary | ICD-10-CM | POA: Diagnosis not present

## 2023-03-11 DIAGNOSIS — Z713 Dietary counseling and surveillance: Secondary | ICD-10-CM | POA: Diagnosis not present

## 2023-03-11 DIAGNOSIS — E782 Mixed hyperlipidemia: Secondary | ICD-10-CM | POA: Diagnosis not present

## 2023-03-11 DIAGNOSIS — K635 Polyp of colon: Secondary | ICD-10-CM | POA: Diagnosis not present

## 2023-03-11 DIAGNOSIS — R7301 Impaired fasting glucose: Secondary | ICD-10-CM | POA: Diagnosis not present

## 2023-03-11 DIAGNOSIS — R945 Abnormal results of liver function studies: Secondary | ICD-10-CM | POA: Diagnosis not present

## 2023-03-11 DIAGNOSIS — F17210 Nicotine dependence, cigarettes, uncomplicated: Secondary | ICD-10-CM | POA: Diagnosis not present

## 2023-03-11 DIAGNOSIS — Z1231 Encounter for screening mammogram for malignant neoplasm of breast: Secondary | ICD-10-CM

## 2023-03-11 DIAGNOSIS — K219 Gastro-esophageal reflux disease without esophagitis: Secondary | ICD-10-CM | POA: Diagnosis not present

## 2023-03-19 ENCOUNTER — Ambulatory Visit (HOSPITAL_COMMUNITY)
Admission: RE | Admit: 2023-03-19 | Discharge: 2023-03-19 | Disposition: A | Discharge status: Home / Self Care | Payer: PPO | Source: Ambulatory Visit | Attending: Family Medicine | Admitting: Family Medicine

## 2023-03-19 ENCOUNTER — Encounter (HOSPITAL_COMMUNITY): Payer: Self-pay

## 2023-03-19 DIAGNOSIS — Z1231 Encounter for screening mammogram for malignant neoplasm of breast: Secondary | ICD-10-CM | POA: Diagnosis not present

## 2023-09-07 DIAGNOSIS — E782 Mixed hyperlipidemia: Secondary | ICD-10-CM | POA: Diagnosis not present

## 2023-09-07 DIAGNOSIS — R7301 Impaired fasting glucose: Secondary | ICD-10-CM | POA: Diagnosis not present

## 2023-09-11 DIAGNOSIS — K219 Gastro-esophageal reflux disease without esophagitis: Secondary | ICD-10-CM | POA: Diagnosis not present

## 2023-09-11 DIAGNOSIS — R7301 Impaired fasting glucose: Secondary | ICD-10-CM | POA: Diagnosis not present

## 2023-09-11 DIAGNOSIS — Z713 Dietary counseling and surveillance: Secondary | ICD-10-CM | POA: Diagnosis not present

## 2023-09-11 DIAGNOSIS — F17200 Nicotine dependence, unspecified, uncomplicated: Secondary | ICD-10-CM | POA: Diagnosis not present

## 2023-09-11 DIAGNOSIS — Z79899 Other long term (current) drug therapy: Secondary | ICD-10-CM | POA: Diagnosis not present

## 2023-09-11 DIAGNOSIS — F17210 Nicotine dependence, cigarettes, uncomplicated: Secondary | ICD-10-CM | POA: Diagnosis not present

## 2023-09-11 DIAGNOSIS — Z6820 Body mass index (BMI) 20.0-20.9, adult: Secondary | ICD-10-CM | POA: Diagnosis not present

## 2023-09-11 DIAGNOSIS — K635 Polyp of colon: Secondary | ICD-10-CM | POA: Diagnosis not present

## 2023-09-11 DIAGNOSIS — R945 Abnormal results of liver function studies: Secondary | ICD-10-CM | POA: Diagnosis not present

## 2023-09-11 DIAGNOSIS — E782 Mixed hyperlipidemia: Secondary | ICD-10-CM | POA: Diagnosis not present

## 2023-09-11 DIAGNOSIS — M810 Age-related osteoporosis without current pathological fracture: Secondary | ICD-10-CM | POA: Diagnosis not present

## 2024-03-04 DIAGNOSIS — E782 Mixed hyperlipidemia: Secondary | ICD-10-CM | POA: Diagnosis not present

## 2024-03-04 DIAGNOSIS — R7301 Impaired fasting glucose: Secondary | ICD-10-CM | POA: Diagnosis not present

## 2024-03-10 DIAGNOSIS — R7301 Impaired fasting glucose: Secondary | ICD-10-CM | POA: Diagnosis not present

## 2024-03-10 DIAGNOSIS — Z681 Body mass index (BMI) 19 or less, adult: Secondary | ICD-10-CM | POA: Diagnosis not present

## 2024-03-10 DIAGNOSIS — Z713 Dietary counseling and surveillance: Secondary | ICD-10-CM | POA: Diagnosis not present

## 2024-03-10 DIAGNOSIS — R7989 Other specified abnormal findings of blood chemistry: Secondary | ICD-10-CM | POA: Diagnosis not present

## 2024-03-10 DIAGNOSIS — M180 Bilateral primary osteoarthritis of first carpometacarpal joints: Secondary | ICD-10-CM | POA: Diagnosis not present

## 2024-03-10 DIAGNOSIS — F4320 Adjustment disorder, unspecified: Secondary | ICD-10-CM | POA: Diagnosis not present

## 2024-03-10 DIAGNOSIS — K635 Polyp of colon: Secondary | ICD-10-CM | POA: Diagnosis not present

## 2024-03-10 DIAGNOSIS — K219 Gastro-esophageal reflux disease without esophagitis: Secondary | ICD-10-CM | POA: Diagnosis not present

## 2024-03-10 DIAGNOSIS — F17200 Nicotine dependence, unspecified, uncomplicated: Secondary | ICD-10-CM | POA: Diagnosis not present

## 2024-03-10 DIAGNOSIS — Z79899 Other long term (current) drug therapy: Secondary | ICD-10-CM | POA: Diagnosis not present

## 2024-03-10 DIAGNOSIS — E782 Mixed hyperlipidemia: Secondary | ICD-10-CM | POA: Diagnosis not present

## 2024-03-10 DIAGNOSIS — R945 Abnormal results of liver function studies: Secondary | ICD-10-CM | POA: Diagnosis not present

## 2024-03-28 ENCOUNTER — Other Ambulatory Visit (HOSPITAL_COMMUNITY): Payer: Self-pay | Admitting: Family Medicine

## 2024-03-28 DIAGNOSIS — Z1231 Encounter for screening mammogram for malignant neoplasm of breast: Secondary | ICD-10-CM

## 2024-04-11 ENCOUNTER — Ambulatory Visit (HOSPITAL_COMMUNITY)
Admission: RE | Admit: 2024-04-11 | Discharge: 2024-04-11 | Disposition: A | Discharge status: Home / Self Care | Source: Ambulatory Visit | Attending: Family Medicine | Admitting: Family Medicine

## 2024-04-11 DIAGNOSIS — Z1231 Encounter for screening mammogram for malignant neoplasm of breast: Secondary | ICD-10-CM | POA: Insufficient documentation

## 2024-09-05 DIAGNOSIS — E782 Mixed hyperlipidemia: Secondary | ICD-10-CM | POA: Diagnosis not present

## 2024-09-05 DIAGNOSIS — R7301 Impaired fasting glucose: Secondary | ICD-10-CM | POA: Diagnosis not present

## 2024-09-09 DIAGNOSIS — K219 Gastro-esophageal reflux disease without esophagitis: Secondary | ICD-10-CM | POA: Diagnosis not present

## 2024-09-09 DIAGNOSIS — F17210 Nicotine dependence, cigarettes, uncomplicated: Secondary | ICD-10-CM | POA: Diagnosis not present

## 2024-09-09 DIAGNOSIS — F17200 Nicotine dependence, unspecified, uncomplicated: Secondary | ICD-10-CM | POA: Diagnosis not present

## 2024-09-09 DIAGNOSIS — R718 Other abnormality of red blood cells: Secondary | ICD-10-CM | POA: Diagnosis not present

## 2024-09-09 DIAGNOSIS — Z79899 Other long term (current) drug therapy: Secondary | ICD-10-CM | POA: Diagnosis not present

## 2024-09-09 DIAGNOSIS — Z0001 Encounter for general adult medical examination with abnormal findings: Secondary | ICD-10-CM | POA: Diagnosis not present

## 2024-09-09 DIAGNOSIS — R7301 Impaired fasting glucose: Secondary | ICD-10-CM | POA: Diagnosis not present

## 2024-09-09 DIAGNOSIS — K635 Polyp of colon: Secondary | ICD-10-CM | POA: Diagnosis not present

## 2024-09-09 DIAGNOSIS — E782 Mixed hyperlipidemia: Secondary | ICD-10-CM | POA: Diagnosis not present

## 2024-09-09 DIAGNOSIS — R945 Abnormal results of liver function studies: Secondary | ICD-10-CM | POA: Diagnosis not present

## 2024-09-09 DIAGNOSIS — M810 Age-related osteoporosis without current pathological fracture: Secondary | ICD-10-CM | POA: Diagnosis not present
